# Patient Record
Sex: Female | Born: 1950 | Race: White | Hispanic: No | Marital: Married | Smoking: Never smoker
Health system: Southern US, Community
[De-identification: ages and names within clinical notes are randomized; demographics above are authoritative.]

## PROBLEM LIST (undated history)

## (undated) DIAGNOSIS — F418 Other specified anxiety disorders: Secondary | ICD-10-CM

## (undated) DIAGNOSIS — E079 Disorder of thyroid, unspecified: Secondary | ICD-10-CM

## (undated) DIAGNOSIS — E7800 Pure hypercholesterolemia, unspecified: Secondary | ICD-10-CM

## (undated) DIAGNOSIS — E559 Vitamin D deficiency, unspecified: Secondary | ICD-10-CM

## (undated) DIAGNOSIS — R251 Tremor, unspecified: Secondary | ICD-10-CM

## (undated) DIAGNOSIS — E0590 Thyrotoxicosis, unspecified without thyrotoxic crisis or storm: Secondary | ICD-10-CM

## (undated) DIAGNOSIS — E042 Nontoxic multinodular goiter: Secondary | ICD-10-CM

## (undated) DIAGNOSIS — M8580 Other specified disorders of bone density and structure, unspecified site: Secondary | ICD-10-CM

## (undated) HISTORY — DX: Other specified anxiety disorders: F41.8

## (undated) HISTORY — DX: Nontoxic multinodular goiter: E04.2

## (undated) HISTORY — DX: Vitamin D deficiency, unspecified: E55.9

## (undated) HISTORY — DX: Other specified disorders of bone density and structure, unspecified site: M85.80

## (undated) HISTORY — DX: Tremor, unspecified: R25.1

## (undated) HISTORY — DX: Thyrotoxicosis, unspecified without thyrotoxic crisis or storm: E05.90

---

## 2013-11-03 ENCOUNTER — Other Ambulatory Visit: Payer: Self-pay | Admitting: Orthopedic Surgery

## 2013-11-03 DIAGNOSIS — M25512 Pain in left shoulder: Secondary | ICD-10-CM

## 2013-11-11 ENCOUNTER — Ambulatory Visit
Admission: RE | Admit: 2013-11-11 | Discharge: 2013-11-11 | Disposition: A | Discharge status: Home / Self Care | Payer: Medicare Other | Source: Ambulatory Visit | Attending: Orthopedic Surgery | Admitting: Orthopedic Surgery

## 2013-11-11 DIAGNOSIS — M25512 Pain in left shoulder: Secondary | ICD-10-CM

## 2013-11-11 MED ORDER — IOHEXOL 180 MG/ML  SOLN
15.0000 mL | Freq: Once | INTRAMUSCULAR | Status: AC | PRN
  Administered 2013-11-11: 15 mL via INTRA_ARTICULAR

## 2015-08-31 DIAGNOSIS — Z23 Encounter for immunization: Secondary | ICD-10-CM | POA: Diagnosis not present

## 2015-08-31 DIAGNOSIS — M8589 Other specified disorders of bone density and structure, multiple sites: Secondary | ICD-10-CM | POA: Diagnosis not present

## 2015-08-31 DIAGNOSIS — F411 Generalized anxiety disorder: Secondary | ICD-10-CM | POA: Diagnosis not present

## 2015-08-31 DIAGNOSIS — Z1231 Encounter for screening mammogram for malignant neoplasm of breast: Secondary | ICD-10-CM | POA: Diagnosis not present

## 2015-08-31 DIAGNOSIS — Z6826 Body mass index (BMI) 26.0-26.9, adult: Secondary | ICD-10-CM | POA: Diagnosis not present

## 2015-08-31 DIAGNOSIS — E785 Hyperlipidemia, unspecified: Secondary | ICD-10-CM | POA: Diagnosis not present

## 2015-08-31 DIAGNOSIS — F329 Major depressive disorder, single episode, unspecified: Secondary | ICD-10-CM | POA: Diagnosis not present

## 2015-09-05 DIAGNOSIS — Z1231 Encounter for screening mammogram for malignant neoplasm of breast: Secondary | ICD-10-CM | POA: Diagnosis not present

## 2015-09-05 DIAGNOSIS — M8589 Other specified disorders of bone density and structure, multiple sites: Secondary | ICD-10-CM | POA: Diagnosis not present

## 2015-09-11 DIAGNOSIS — H6092 Unspecified otitis externa, left ear: Secondary | ICD-10-CM | POA: Diagnosis not present

## 2015-12-07 DIAGNOSIS — Z9181 History of falling: Secondary | ICD-10-CM | POA: Diagnosis not present

## 2015-12-07 DIAGNOSIS — Z1389 Encounter for screening for other disorder: Secondary | ICD-10-CM | POA: Diagnosis not present

## 2015-12-07 DIAGNOSIS — E559 Vitamin D deficiency, unspecified: Secondary | ICD-10-CM | POA: Diagnosis not present

## 2015-12-07 DIAGNOSIS — Z79899 Other long term (current) drug therapy: Secondary | ICD-10-CM | POA: Diagnosis not present

## 2015-12-07 DIAGNOSIS — E785 Hyperlipidemia, unspecified: Secondary | ICD-10-CM | POA: Diagnosis not present

## 2015-12-07 DIAGNOSIS — Z6825 Body mass index (BMI) 25.0-25.9, adult: Secondary | ICD-10-CM | POA: Diagnosis not present

## 2015-12-07 DIAGNOSIS — Z0000 Encounter for general adult medical examination without abnormal findings: Secondary | ICD-10-CM | POA: Diagnosis not present

## 2015-12-07 DIAGNOSIS — Z23 Encounter for immunization: Secondary | ICD-10-CM | POA: Diagnosis not present

## 2016-01-05 DIAGNOSIS — R079 Chest pain, unspecified: Secondary | ICD-10-CM | POA: Diagnosis not present

## 2016-01-05 DIAGNOSIS — R0789 Other chest pain: Secondary | ICD-10-CM | POA: Diagnosis not present

## 2016-01-19 DIAGNOSIS — M5412 Radiculopathy, cervical region: Secondary | ICD-10-CM | POA: Diagnosis not present

## 2016-01-19 DIAGNOSIS — Z6826 Body mass index (BMI) 26.0-26.9, adult: Secondary | ICD-10-CM | POA: Diagnosis not present

## 2016-02-12 DIAGNOSIS — M5412 Radiculopathy, cervical region: Secondary | ICD-10-CM | POA: Diagnosis not present

## 2016-02-17 DIAGNOSIS — M5412 Radiculopathy, cervical region: Secondary | ICD-10-CM | POA: Diagnosis not present

## 2016-02-21 DIAGNOSIS — M25512 Pain in left shoulder: Secondary | ICD-10-CM | POA: Diagnosis not present

## 2016-02-21 DIAGNOSIS — M5412 Radiculopathy, cervical region: Secondary | ICD-10-CM | POA: Diagnosis not present

## 2016-02-21 DIAGNOSIS — M6281 Muscle weakness (generalized): Secondary | ICD-10-CM | POA: Diagnosis not present

## 2016-02-23 DIAGNOSIS — M6281 Muscle weakness (generalized): Secondary | ICD-10-CM | POA: Diagnosis not present

## 2016-02-23 DIAGNOSIS — M5412 Radiculopathy, cervical region: Secondary | ICD-10-CM | POA: Diagnosis not present

## 2016-02-23 DIAGNOSIS — M25512 Pain in left shoulder: Secondary | ICD-10-CM | POA: Diagnosis not present

## 2016-02-28 DIAGNOSIS — M5412 Radiculopathy, cervical region: Secondary | ICD-10-CM | POA: Diagnosis not present

## 2016-02-28 DIAGNOSIS — M6281 Muscle weakness (generalized): Secondary | ICD-10-CM | POA: Diagnosis not present

## 2016-02-28 DIAGNOSIS — M25512 Pain in left shoulder: Secondary | ICD-10-CM | POA: Diagnosis not present

## 2016-03-01 DIAGNOSIS — M5412 Radiculopathy, cervical region: Secondary | ICD-10-CM | POA: Diagnosis not present

## 2016-03-01 DIAGNOSIS — M25512 Pain in left shoulder: Secondary | ICD-10-CM | POA: Diagnosis not present

## 2016-03-01 DIAGNOSIS — M6281 Muscle weakness (generalized): Secondary | ICD-10-CM | POA: Diagnosis not present

## 2016-03-06 DIAGNOSIS — M6281 Muscle weakness (generalized): Secondary | ICD-10-CM | POA: Diagnosis not present

## 2016-03-06 DIAGNOSIS — M5412 Radiculopathy, cervical region: Secondary | ICD-10-CM | POA: Diagnosis not present

## 2016-03-06 DIAGNOSIS — M25512 Pain in left shoulder: Secondary | ICD-10-CM | POA: Diagnosis not present

## 2016-03-08 DIAGNOSIS — M25512 Pain in left shoulder: Secondary | ICD-10-CM | POA: Diagnosis not present

## 2016-03-08 DIAGNOSIS — M6281 Muscle weakness (generalized): Secondary | ICD-10-CM | POA: Diagnosis not present

## 2016-03-08 DIAGNOSIS — M5412 Radiculopathy, cervical region: Secondary | ICD-10-CM | POA: Diagnosis not present

## 2016-03-12 DIAGNOSIS — M6281 Muscle weakness (generalized): Secondary | ICD-10-CM | POA: Diagnosis not present

## 2016-03-12 DIAGNOSIS — M5412 Radiculopathy, cervical region: Secondary | ICD-10-CM | POA: Diagnosis not present

## 2016-03-12 DIAGNOSIS — M25512 Pain in left shoulder: Secondary | ICD-10-CM | POA: Diagnosis not present

## 2016-03-15 DIAGNOSIS — M5412 Radiculopathy, cervical region: Secondary | ICD-10-CM | POA: Diagnosis not present

## 2016-03-15 DIAGNOSIS — M25512 Pain in left shoulder: Secondary | ICD-10-CM | POA: Diagnosis not present

## 2016-03-15 DIAGNOSIS — M6281 Muscle weakness (generalized): Secondary | ICD-10-CM | POA: Diagnosis not present

## 2016-03-21 DIAGNOSIS — M6281 Muscle weakness (generalized): Secondary | ICD-10-CM | POA: Diagnosis not present

## 2016-03-21 DIAGNOSIS — M5412 Radiculopathy, cervical region: Secondary | ICD-10-CM | POA: Diagnosis not present

## 2016-03-21 DIAGNOSIS — M25512 Pain in left shoulder: Secondary | ICD-10-CM | POA: Diagnosis not present

## 2016-04-10 DIAGNOSIS — M5412 Radiculopathy, cervical region: Secondary | ICD-10-CM | POA: Diagnosis not present

## 2016-04-19 DIAGNOSIS — M50222 Other cervical disc displacement at C5-C6 level: Secondary | ICD-10-CM | POA: Diagnosis not present

## 2016-04-19 DIAGNOSIS — M5412 Radiculopathy, cervical region: Secondary | ICD-10-CM | POA: Diagnosis not present

## 2016-04-19 DIAGNOSIS — M50221 Other cervical disc displacement at C4-C5 level: Secondary | ICD-10-CM | POA: Diagnosis not present

## 2016-04-19 DIAGNOSIS — M50223 Other cervical disc displacement at C6-C7 level: Secondary | ICD-10-CM | POA: Diagnosis not present

## 2016-04-24 DIAGNOSIS — M67432 Ganglion, left wrist: Secondary | ICD-10-CM | POA: Diagnosis not present

## 2016-04-24 DIAGNOSIS — M5412 Radiculopathy, cervical region: Secondary | ICD-10-CM | POA: Diagnosis not present

## 2016-06-05 DIAGNOSIS — M67432 Ganglion, left wrist: Secondary | ICD-10-CM | POA: Diagnosis not present

## 2016-06-12 DIAGNOSIS — R030 Elevated blood-pressure reading, without diagnosis of hypertension: Secondary | ICD-10-CM | POA: Diagnosis not present

## 2016-06-12 DIAGNOSIS — E785 Hyperlipidemia, unspecified: Secondary | ICD-10-CM | POA: Diagnosis not present

## 2016-06-12 DIAGNOSIS — E559 Vitamin D deficiency, unspecified: Secondary | ICD-10-CM | POA: Diagnosis not present

## 2016-06-12 DIAGNOSIS — F329 Major depressive disorder, single episode, unspecified: Secondary | ICD-10-CM | POA: Diagnosis not present

## 2016-06-12 DIAGNOSIS — F411 Generalized anxiety disorder: Secondary | ICD-10-CM | POA: Diagnosis not present

## 2016-06-18 DIAGNOSIS — Z79899 Other long term (current) drug therapy: Secondary | ICD-10-CM | POA: Diagnosis not present

## 2016-06-18 DIAGNOSIS — M67432 Ganglion, left wrist: Secondary | ICD-10-CM | POA: Diagnosis not present

## 2016-06-18 DIAGNOSIS — F419 Anxiety disorder, unspecified: Secondary | ICD-10-CM | POA: Diagnosis not present

## 2016-06-18 DIAGNOSIS — F329 Major depressive disorder, single episode, unspecified: Secondary | ICD-10-CM | POA: Diagnosis not present

## 2016-06-18 DIAGNOSIS — M67442 Ganglion, left hand: Secondary | ICD-10-CM | POA: Diagnosis not present

## 2016-06-18 DIAGNOSIS — E7800 Pure hypercholesterolemia, unspecified: Secondary | ICD-10-CM | POA: Diagnosis not present

## 2016-06-18 DIAGNOSIS — E559 Vitamin D deficiency, unspecified: Secondary | ICD-10-CM | POA: Diagnosis not present

## 2016-07-03 DIAGNOSIS — M67432 Ganglion, left wrist: Secondary | ICD-10-CM | POA: Diagnosis not present

## 2016-08-19 DIAGNOSIS — J218 Acute bronchiolitis due to other specified organisms: Secondary | ICD-10-CM | POA: Diagnosis not present

## 2016-08-20 DIAGNOSIS — R000 Tachycardia, unspecified: Secondary | ICD-10-CM | POA: Diagnosis not present

## 2016-11-05 DIAGNOSIS — R000 Tachycardia, unspecified: Secondary | ICD-10-CM | POA: Diagnosis not present

## 2016-11-05 DIAGNOSIS — R946 Abnormal results of thyroid function studies: Secondary | ICD-10-CM | POA: Diagnosis not present

## 2016-11-05 DIAGNOSIS — Z1231 Encounter for screening mammogram for malignant neoplasm of breast: Secondary | ICD-10-CM | POA: Diagnosis not present

## 2016-11-15 DIAGNOSIS — R000 Tachycardia, unspecified: Secondary | ICD-10-CM | POA: Diagnosis not present

## 2016-11-15 DIAGNOSIS — E785 Hyperlipidemia, unspecified: Secondary | ICD-10-CM | POA: Diagnosis not present

## 2016-11-15 DIAGNOSIS — Z7689 Persons encountering health services in other specified circumstances: Secondary | ICD-10-CM | POA: Diagnosis not present

## 2016-11-16 DIAGNOSIS — Z1231 Encounter for screening mammogram for malignant neoplasm of breast: Secondary | ICD-10-CM | POA: Diagnosis not present

## 2016-12-03 DIAGNOSIS — R0609 Other forms of dyspnea: Secondary | ICD-10-CM | POA: Diagnosis not present

## 2016-12-03 DIAGNOSIS — R000 Tachycardia, unspecified: Secondary | ICD-10-CM | POA: Diagnosis not present

## 2016-12-05 DIAGNOSIS — R000 Tachycardia, unspecified: Secondary | ICD-10-CM | POA: Diagnosis not present

## 2016-12-11 DIAGNOSIS — F329 Major depressive disorder, single episode, unspecified: Secondary | ICD-10-CM | POA: Diagnosis not present

## 2016-12-11 DIAGNOSIS — Z139 Encounter for screening, unspecified: Secondary | ICD-10-CM | POA: Diagnosis not present

## 2016-12-11 DIAGNOSIS — Z0000 Encounter for general adult medical examination without abnormal findings: Secondary | ICD-10-CM | POA: Diagnosis not present

## 2016-12-11 DIAGNOSIS — R946 Abnormal results of thyroid function studies: Secondary | ICD-10-CM | POA: Diagnosis not present

## 2016-12-11 DIAGNOSIS — E785 Hyperlipidemia, unspecified: Secondary | ICD-10-CM | POA: Diagnosis not present

## 2016-12-11 DIAGNOSIS — F411 Generalized anxiety disorder: Secondary | ICD-10-CM | POA: Diagnosis not present

## 2016-12-11 DIAGNOSIS — R000 Tachycardia, unspecified: Secondary | ICD-10-CM | POA: Diagnosis not present

## 2016-12-11 DIAGNOSIS — Z9181 History of falling: Secondary | ICD-10-CM | POA: Diagnosis not present

## 2016-12-11 DIAGNOSIS — Z1389 Encounter for screening for other disorder: Secondary | ICD-10-CM | POA: Diagnosis not present

## 2017-01-15 DIAGNOSIS — Z136 Encounter for screening for cardiovascular disorders: Secondary | ICD-10-CM | POA: Diagnosis not present

## 2017-01-15 DIAGNOSIS — Z23 Encounter for immunization: Secondary | ICD-10-CM | POA: Diagnosis not present

## 2017-01-15 DIAGNOSIS — Z1389 Encounter for screening for other disorder: Secondary | ICD-10-CM | POA: Diagnosis not present

## 2017-01-15 DIAGNOSIS — Z9181 History of falling: Secondary | ICD-10-CM | POA: Diagnosis not present

## 2017-01-15 DIAGNOSIS — E785 Hyperlipidemia, unspecified: Secondary | ICD-10-CM | POA: Diagnosis not present

## 2017-01-15 DIAGNOSIS — Z0000 Encounter for general adult medical examination without abnormal findings: Secondary | ICD-10-CM | POA: Diagnosis not present

## 2017-02-04 DIAGNOSIS — E0590 Thyrotoxicosis, unspecified without thyrotoxic crisis or storm: Secondary | ICD-10-CM | POA: Diagnosis not present

## 2017-02-08 DIAGNOSIS — E0590 Thyrotoxicosis, unspecified without thyrotoxic crisis or storm: Secondary | ICD-10-CM | POA: Diagnosis not present

## 2017-02-08 DIAGNOSIS — E042 Nontoxic multinodular goiter: Secondary | ICD-10-CM | POA: Diagnosis not present

## 2017-03-11 DIAGNOSIS — E042 Nontoxic multinodular goiter: Secondary | ICD-10-CM | POA: Diagnosis not present

## 2017-03-11 DIAGNOSIS — E0590 Thyrotoxicosis, unspecified without thyrotoxic crisis or storm: Secondary | ICD-10-CM | POA: Diagnosis not present

## 2017-03-21 DIAGNOSIS — F411 Generalized anxiety disorder: Secondary | ICD-10-CM | POA: Diagnosis not present

## 2017-03-21 DIAGNOSIS — E559 Vitamin D deficiency, unspecified: Secondary | ICD-10-CM | POA: Diagnosis not present

## 2017-03-21 DIAGNOSIS — E0590 Thyrotoxicosis, unspecified without thyrotoxic crisis or storm: Secondary | ICD-10-CM | POA: Diagnosis not present

## 2017-03-21 DIAGNOSIS — E785 Hyperlipidemia, unspecified: Secondary | ICD-10-CM | POA: Diagnosis not present

## 2017-03-21 DIAGNOSIS — Z6827 Body mass index (BMI) 27.0-27.9, adult: Secondary | ICD-10-CM | POA: Diagnosis not present

## 2017-03-21 DIAGNOSIS — R000 Tachycardia, unspecified: Secondary | ICD-10-CM | POA: Diagnosis not present

## 2017-06-06 DIAGNOSIS — R300 Dysuria: Secondary | ICD-10-CM | POA: Diagnosis not present

## 2017-06-06 DIAGNOSIS — N390 Urinary tract infection, site not specified: Secondary | ICD-10-CM | POA: Diagnosis not present

## 2017-06-06 DIAGNOSIS — Z23 Encounter for immunization: Secondary | ICD-10-CM | POA: Diagnosis not present

## 2017-06-12 DIAGNOSIS — E042 Nontoxic multinodular goiter: Secondary | ICD-10-CM | POA: Diagnosis not present

## 2017-06-12 DIAGNOSIS — E559 Vitamin D deficiency, unspecified: Secondary | ICD-10-CM | POA: Diagnosis not present

## 2017-06-12 DIAGNOSIS — E0590 Thyrotoxicosis, unspecified without thyrotoxic crisis or storm: Secondary | ICD-10-CM | POA: Diagnosis not present

## 2017-06-25 DIAGNOSIS — R002 Palpitations: Secondary | ICD-10-CM | POA: Diagnosis not present

## 2017-06-25 DIAGNOSIS — E0590 Thyrotoxicosis, unspecified without thyrotoxic crisis or storm: Secondary | ICD-10-CM | POA: Diagnosis not present

## 2017-06-25 DIAGNOSIS — I10 Essential (primary) hypertension: Secondary | ICD-10-CM | POA: Diagnosis not present

## 2017-06-25 DIAGNOSIS — E785 Hyperlipidemia, unspecified: Secondary | ICD-10-CM | POA: Diagnosis not present

## 2017-07-24 DIAGNOSIS — E0590 Thyrotoxicosis, unspecified without thyrotoxic crisis or storm: Secondary | ICD-10-CM | POA: Diagnosis not present

## 2017-07-24 DIAGNOSIS — E785 Hyperlipidemia, unspecified: Secondary | ICD-10-CM | POA: Diagnosis not present

## 2017-07-24 DIAGNOSIS — Z6828 Body mass index (BMI) 28.0-28.9, adult: Secondary | ICD-10-CM | POA: Diagnosis not present

## 2017-07-24 DIAGNOSIS — E559 Vitamin D deficiency, unspecified: Secondary | ICD-10-CM | POA: Diagnosis not present

## 2017-07-24 DIAGNOSIS — F411 Generalized anxiety disorder: Secondary | ICD-10-CM | POA: Diagnosis not present

## 2017-07-24 DIAGNOSIS — F329 Major depressive disorder, single episode, unspecified: Secondary | ICD-10-CM | POA: Diagnosis not present

## 2017-10-15 DIAGNOSIS — E042 Nontoxic multinodular goiter: Secondary | ICD-10-CM | POA: Diagnosis not present

## 2017-10-15 DIAGNOSIS — E559 Vitamin D deficiency, unspecified: Secondary | ICD-10-CM | POA: Diagnosis not present

## 2017-10-15 DIAGNOSIS — E0590 Thyrotoxicosis, unspecified without thyrotoxic crisis or storm: Secondary | ICD-10-CM | POA: Diagnosis not present

## 2017-11-26 DIAGNOSIS — E559 Vitamin D deficiency, unspecified: Secondary | ICD-10-CM | POA: Diagnosis not present

## 2017-11-26 DIAGNOSIS — Z1211 Encounter for screening for malignant neoplasm of colon: Secondary | ICD-10-CM | POA: Diagnosis not present

## 2017-11-26 DIAGNOSIS — R739 Hyperglycemia, unspecified: Secondary | ICD-10-CM | POA: Diagnosis not present

## 2017-11-26 DIAGNOSIS — E0590 Thyrotoxicosis, unspecified without thyrotoxic crisis or storm: Secondary | ICD-10-CM | POA: Diagnosis not present

## 2017-11-26 DIAGNOSIS — Z1231 Encounter for screening mammogram for malignant neoplasm of breast: Secondary | ICD-10-CM | POA: Diagnosis not present

## 2017-11-26 DIAGNOSIS — F329 Major depressive disorder, single episode, unspecified: Secondary | ICD-10-CM | POA: Diagnosis not present

## 2017-11-26 DIAGNOSIS — E785 Hyperlipidemia, unspecified: Secondary | ICD-10-CM | POA: Diagnosis not present

## 2017-11-26 DIAGNOSIS — M8589 Other specified disorders of bone density and structure, multiple sites: Secondary | ICD-10-CM | POA: Diagnosis not present

## 2017-12-16 DIAGNOSIS — Z1211 Encounter for screening for malignant neoplasm of colon: Secondary | ICD-10-CM | POA: Diagnosis not present

## 2017-12-16 DIAGNOSIS — Z1212 Encounter for screening for malignant neoplasm of rectum: Secondary | ICD-10-CM | POA: Diagnosis not present

## 2018-01-01 DIAGNOSIS — M8580 Other specified disorders of bone density and structure, unspecified site: Secondary | ICD-10-CM | POA: Diagnosis not present

## 2018-01-01 DIAGNOSIS — Z1231 Encounter for screening mammogram for malignant neoplasm of breast: Secondary | ICD-10-CM | POA: Diagnosis not present

## 2018-01-01 DIAGNOSIS — M8589 Other specified disorders of bone density and structure, multiple sites: Secondary | ICD-10-CM | POA: Diagnosis not present

## 2018-01-01 DIAGNOSIS — R928 Other abnormal and inconclusive findings on diagnostic imaging of breast: Secondary | ICD-10-CM | POA: Diagnosis not present

## 2018-01-22 DIAGNOSIS — R922 Inconclusive mammogram: Secondary | ICD-10-CM | POA: Diagnosis not present

## 2018-01-22 DIAGNOSIS — R928 Other abnormal and inconclusive findings on diagnostic imaging of breast: Secondary | ICD-10-CM | POA: Diagnosis not present

## 2018-01-22 DIAGNOSIS — N6489 Other specified disorders of breast: Secondary | ICD-10-CM | POA: Diagnosis not present

## 2018-02-10 DIAGNOSIS — E042 Nontoxic multinodular goiter: Secondary | ICD-10-CM | POA: Diagnosis not present

## 2018-03-22 DIAGNOSIS — N3090 Cystitis, unspecified without hematuria: Secondary | ICD-10-CM | POA: Diagnosis not present

## 2018-03-22 DIAGNOSIS — N3001 Acute cystitis with hematuria: Secondary | ICD-10-CM | POA: Diagnosis not present

## 2018-03-23 DIAGNOSIS — N3091 Cystitis, unspecified with hematuria: Secondary | ICD-10-CM | POA: Diagnosis not present

## 2018-03-25 DIAGNOSIS — N3001 Acute cystitis with hematuria: Secondary | ICD-10-CM | POA: Diagnosis not present

## 2018-03-25 DIAGNOSIS — R3915 Urgency of urination: Secondary | ICD-10-CM | POA: Diagnosis not present

## 2018-04-03 DIAGNOSIS — E785 Hyperlipidemia, unspecified: Secondary | ICD-10-CM | POA: Diagnosis not present

## 2018-04-03 DIAGNOSIS — R739 Hyperglycemia, unspecified: Secondary | ICD-10-CM | POA: Diagnosis not present

## 2018-04-03 DIAGNOSIS — F411 Generalized anxiety disorder: Secondary | ICD-10-CM | POA: Diagnosis not present

## 2018-04-03 DIAGNOSIS — E0590 Thyrotoxicosis, unspecified without thyrotoxic crisis or storm: Secondary | ICD-10-CM | POA: Diagnosis not present

## 2018-04-03 DIAGNOSIS — E559 Vitamin D deficiency, unspecified: Secondary | ICD-10-CM | POA: Diagnosis not present

## 2018-04-18 DIAGNOSIS — E0590 Thyrotoxicosis, unspecified without thyrotoxic crisis or storm: Secondary | ICD-10-CM | POA: Diagnosis not present

## 2018-04-18 DIAGNOSIS — E042 Nontoxic multinodular goiter: Secondary | ICD-10-CM | POA: Diagnosis not present

## 2018-04-18 DIAGNOSIS — E559 Vitamin D deficiency, unspecified: Secondary | ICD-10-CM | POA: Diagnosis not present

## 2018-04-23 DIAGNOSIS — N939 Abnormal uterine and vaginal bleeding, unspecified: Secondary | ICD-10-CM | POA: Diagnosis not present

## 2018-04-23 DIAGNOSIS — N952 Postmenopausal atrophic vaginitis: Secondary | ICD-10-CM | POA: Diagnosis not present

## 2018-04-23 DIAGNOSIS — B373 Candidiasis of vulva and vagina: Secondary | ICD-10-CM | POA: Diagnosis not present

## 2018-04-23 DIAGNOSIS — N390 Urinary tract infection, site not specified: Secondary | ICD-10-CM | POA: Diagnosis not present

## 2018-04-23 DIAGNOSIS — Z79899 Other long term (current) drug therapy: Secondary | ICD-10-CM | POA: Diagnosis not present

## 2018-04-23 DIAGNOSIS — N3946 Mixed incontinence: Secondary | ICD-10-CM | POA: Diagnosis not present

## 2018-04-23 DIAGNOSIS — R319 Hematuria, unspecified: Secondary | ICD-10-CM | POA: Diagnosis not present

## 2018-05-02 DIAGNOSIS — R319 Hematuria, unspecified: Secondary | ICD-10-CM | POA: Diagnosis not present

## 2018-05-21 DIAGNOSIS — N939 Abnormal uterine and vaginal bleeding, unspecified: Secondary | ICD-10-CM | POA: Diagnosis not present

## 2018-05-21 DIAGNOSIS — B373 Candidiasis of vulva and vagina: Secondary | ICD-10-CM | POA: Diagnosis not present

## 2018-05-21 DIAGNOSIS — N390 Urinary tract infection, site not specified: Secondary | ICD-10-CM | POA: Diagnosis not present

## 2018-05-21 DIAGNOSIS — N952 Postmenopausal atrophic vaginitis: Secondary | ICD-10-CM | POA: Diagnosis not present

## 2018-05-21 DIAGNOSIS — R310 Gross hematuria: Secondary | ICD-10-CM | POA: Diagnosis not present

## 2018-06-18 DIAGNOSIS — Z23 Encounter for immunization: Secondary | ICD-10-CM | POA: Diagnosis not present

## 2018-07-01 DIAGNOSIS — E0590 Thyrotoxicosis, unspecified without thyrotoxic crisis or storm: Secondary | ICD-10-CM | POA: Diagnosis not present

## 2018-07-01 DIAGNOSIS — E785 Hyperlipidemia, unspecified: Secondary | ICD-10-CM | POA: Diagnosis not present

## 2018-07-01 DIAGNOSIS — R002 Palpitations: Secondary | ICD-10-CM | POA: Diagnosis not present

## 2018-07-01 DIAGNOSIS — I10 Essential (primary) hypertension: Secondary | ICD-10-CM | POA: Diagnosis not present

## 2018-07-31 DIAGNOSIS — E559 Vitamin D deficiency, unspecified: Secondary | ICD-10-CM | POA: Diagnosis not present

## 2018-07-31 DIAGNOSIS — N952 Postmenopausal atrophic vaginitis: Secondary | ICD-10-CM | POA: Diagnosis not present

## 2018-07-31 DIAGNOSIS — Z6828 Body mass index (BMI) 28.0-28.9, adult: Secondary | ICD-10-CM | POA: Diagnosis not present

## 2018-07-31 DIAGNOSIS — Z139 Encounter for screening, unspecified: Secondary | ICD-10-CM | POA: Diagnosis not present

## 2018-07-31 DIAGNOSIS — E785 Hyperlipidemia, unspecified: Secondary | ICD-10-CM | POA: Diagnosis not present

## 2018-07-31 DIAGNOSIS — F411 Generalized anxiety disorder: Secondary | ICD-10-CM | POA: Diagnosis not present

## 2018-07-31 DIAGNOSIS — E0590 Thyrotoxicosis, unspecified without thyrotoxic crisis or storm: Secondary | ICD-10-CM | POA: Diagnosis not present

## 2018-07-31 DIAGNOSIS — R739 Hyperglycemia, unspecified: Secondary | ICD-10-CM | POA: Diagnosis not present

## 2018-07-31 DIAGNOSIS — Z9181 History of falling: Secondary | ICD-10-CM | POA: Diagnosis not present

## 2018-07-31 DIAGNOSIS — Z1331 Encounter for screening for depression: Secondary | ICD-10-CM | POA: Diagnosis not present

## 2018-07-31 DIAGNOSIS — Z0000 Encounter for general adult medical examination without abnormal findings: Secondary | ICD-10-CM | POA: Diagnosis not present

## 2018-10-02 DIAGNOSIS — J309 Allergic rhinitis, unspecified: Secondary | ICD-10-CM | POA: Diagnosis not present

## 2018-10-02 DIAGNOSIS — Z6828 Body mass index (BMI) 28.0-28.9, adult: Secondary | ICD-10-CM | POA: Diagnosis not present

## 2018-10-02 DIAGNOSIS — J069 Acute upper respiratory infection, unspecified: Secondary | ICD-10-CM | POA: Diagnosis not present

## 2018-10-21 DIAGNOSIS — E559 Vitamin D deficiency, unspecified: Secondary | ICD-10-CM | POA: Diagnosis not present

## 2018-10-21 DIAGNOSIS — E042 Nontoxic multinodular goiter: Secondary | ICD-10-CM | POA: Diagnosis not present

## 2018-10-21 DIAGNOSIS — E0590 Thyrotoxicosis, unspecified without thyrotoxic crisis or storm: Secondary | ICD-10-CM | POA: Diagnosis not present

## 2019-01-29 DIAGNOSIS — R000 Tachycardia, unspecified: Secondary | ICD-10-CM | POA: Diagnosis not present

## 2019-01-29 DIAGNOSIS — E785 Hyperlipidemia, unspecified: Secondary | ICD-10-CM | POA: Diagnosis not present

## 2019-01-29 DIAGNOSIS — Z6828 Body mass index (BMI) 28.0-28.9, adult: Secondary | ICD-10-CM | POA: Diagnosis not present

## 2019-01-29 DIAGNOSIS — E559 Vitamin D deficiency, unspecified: Secondary | ICD-10-CM | POA: Diagnosis not present

## 2019-01-29 DIAGNOSIS — R739 Hyperglycemia, unspecified: Secondary | ICD-10-CM | POA: Diagnosis not present

## 2019-01-29 DIAGNOSIS — F411 Generalized anxiety disorder: Secondary | ICD-10-CM | POA: Diagnosis not present

## 2019-01-29 DIAGNOSIS — E663 Overweight: Secondary | ICD-10-CM | POA: Diagnosis not present

## 2019-01-29 DIAGNOSIS — E0590 Thyrotoxicosis, unspecified without thyrotoxic crisis or storm: Secondary | ICD-10-CM | POA: Diagnosis not present

## 2019-06-01 DIAGNOSIS — R739 Hyperglycemia, unspecified: Secondary | ICD-10-CM | POA: Diagnosis not present

## 2019-06-01 DIAGNOSIS — E663 Overweight: Secondary | ICD-10-CM | POA: Diagnosis not present

## 2019-06-01 DIAGNOSIS — Z23 Encounter for immunization: Secondary | ICD-10-CM | POA: Diagnosis not present

## 2019-06-01 DIAGNOSIS — R251 Tremor, unspecified: Secondary | ICD-10-CM | POA: Diagnosis not present

## 2019-06-01 DIAGNOSIS — Z1231 Encounter for screening mammogram for malignant neoplasm of breast: Secondary | ICD-10-CM | POA: Diagnosis not present

## 2019-06-01 DIAGNOSIS — Z6828 Body mass index (BMI) 28.0-28.9, adult: Secondary | ICD-10-CM | POA: Diagnosis not present

## 2019-06-01 DIAGNOSIS — E0590 Thyrotoxicosis, unspecified without thyrotoxic crisis or storm: Secondary | ICD-10-CM | POA: Diagnosis not present

## 2019-06-01 DIAGNOSIS — F411 Generalized anxiety disorder: Secondary | ICD-10-CM | POA: Diagnosis not present

## 2019-06-01 DIAGNOSIS — E559 Vitamin D deficiency, unspecified: Secondary | ICD-10-CM | POA: Diagnosis not present

## 2019-06-01 DIAGNOSIS — E785 Hyperlipidemia, unspecified: Secondary | ICD-10-CM | POA: Diagnosis not present

## 2019-06-01 DIAGNOSIS — R000 Tachycardia, unspecified: Secondary | ICD-10-CM | POA: Diagnosis not present

## 2019-08-04 DIAGNOSIS — E785 Hyperlipidemia, unspecified: Secondary | ICD-10-CM | POA: Diagnosis not present

## 2019-08-04 DIAGNOSIS — Z1231 Encounter for screening mammogram for malignant neoplasm of breast: Secondary | ICD-10-CM | POA: Diagnosis not present

## 2019-08-04 DIAGNOSIS — Z9181 History of falling: Secondary | ICD-10-CM | POA: Diagnosis not present

## 2019-08-04 DIAGNOSIS — Z0000 Encounter for general adult medical examination without abnormal findings: Secondary | ICD-10-CM | POA: Diagnosis not present

## 2019-08-04 DIAGNOSIS — Z139 Encounter for screening, unspecified: Secondary | ICD-10-CM | POA: Diagnosis not present

## 2019-08-04 DIAGNOSIS — N959 Unspecified menopausal and perimenopausal disorder: Secondary | ICD-10-CM | POA: Diagnosis not present

## 2019-08-04 DIAGNOSIS — Z1331 Encounter for screening for depression: Secondary | ICD-10-CM | POA: Diagnosis not present

## 2019-08-05 DIAGNOSIS — Z1231 Encounter for screening mammogram for malignant neoplasm of breast: Secondary | ICD-10-CM | POA: Diagnosis not present

## 2019-08-27 DIAGNOSIS — Z6828 Body mass index (BMI) 28.0-28.9, adult: Secondary | ICD-10-CM | POA: Diagnosis not present

## 2019-08-27 DIAGNOSIS — B373 Candidiasis of vulva and vagina: Secondary | ICD-10-CM | POA: Diagnosis not present

## 2019-10-13 DIAGNOSIS — E559 Vitamin D deficiency, unspecified: Secondary | ICD-10-CM | POA: Diagnosis not present

## 2019-10-13 DIAGNOSIS — R739 Hyperglycemia, unspecified: Secondary | ICD-10-CM | POA: Diagnosis not present

## 2019-10-13 DIAGNOSIS — F411 Generalized anxiety disorder: Secondary | ICD-10-CM | POA: Diagnosis not present

## 2019-10-13 DIAGNOSIS — R251 Tremor, unspecified: Secondary | ICD-10-CM | POA: Diagnosis not present

## 2019-10-13 DIAGNOSIS — E785 Hyperlipidemia, unspecified: Secondary | ICD-10-CM | POA: Diagnosis not present

## 2019-10-13 DIAGNOSIS — R000 Tachycardia, unspecified: Secondary | ICD-10-CM | POA: Diagnosis not present

## 2019-10-13 DIAGNOSIS — E663 Overweight: Secondary | ICD-10-CM | POA: Diagnosis not present

## 2019-10-13 DIAGNOSIS — Z6829 Body mass index (BMI) 29.0-29.9, adult: Secondary | ICD-10-CM | POA: Diagnosis not present

## 2019-10-13 DIAGNOSIS — E0590 Thyrotoxicosis, unspecified without thyrotoxic crisis or storm: Secondary | ICD-10-CM | POA: Diagnosis not present

## 2019-10-23 DIAGNOSIS — E559 Vitamin D deficiency, unspecified: Secondary | ICD-10-CM | POA: Diagnosis not present

## 2019-10-23 DIAGNOSIS — E0590 Thyrotoxicosis, unspecified without thyrotoxic crisis or storm: Secondary | ICD-10-CM | POA: Diagnosis not present

## 2019-10-23 DIAGNOSIS — E042 Nontoxic multinodular goiter: Secondary | ICD-10-CM | POA: Diagnosis not present

## 2019-10-26 DIAGNOSIS — E042 Nontoxic multinodular goiter: Secondary | ICD-10-CM | POA: Diagnosis not present

## 2020-01-13 DIAGNOSIS — M8589 Other specified disorders of bone density and structure, multiple sites: Secondary | ICD-10-CM | POA: Diagnosis not present

## 2020-01-13 DIAGNOSIS — N959 Unspecified menopausal and perimenopausal disorder: Secondary | ICD-10-CM | POA: Diagnosis not present

## 2020-02-22 DIAGNOSIS — R109 Unspecified abdominal pain: Secondary | ICD-10-CM | POA: Diagnosis not present

## 2020-03-07 DIAGNOSIS — Z20828 Contact with and (suspected) exposure to other viral communicable diseases: Secondary | ICD-10-CM | POA: Diagnosis not present

## 2020-03-07 DIAGNOSIS — R05 Cough: Secondary | ICD-10-CM | POA: Diagnosis not present

## 2020-03-07 DIAGNOSIS — R509 Fever, unspecified: Secondary | ICD-10-CM | POA: Diagnosis not present

## 2020-04-15 DIAGNOSIS — M8580 Other specified disorders of bone density and structure, unspecified site: Secondary | ICD-10-CM | POA: Diagnosis not present

## 2020-04-15 DIAGNOSIS — R739 Hyperglycemia, unspecified: Secondary | ICD-10-CM | POA: Diagnosis not present

## 2020-04-15 DIAGNOSIS — F411 Generalized anxiety disorder: Secondary | ICD-10-CM | POA: Diagnosis not present

## 2020-04-15 DIAGNOSIS — Z6829 Body mass index (BMI) 29.0-29.9, adult: Secondary | ICD-10-CM | POA: Diagnosis not present

## 2020-04-15 DIAGNOSIS — E0590 Thyrotoxicosis, unspecified without thyrotoxic crisis or storm: Secondary | ICD-10-CM | POA: Diagnosis not present

## 2020-04-15 DIAGNOSIS — F3340 Major depressive disorder, recurrent, in remission, unspecified: Secondary | ICD-10-CM | POA: Diagnosis not present

## 2020-04-15 DIAGNOSIS — R000 Tachycardia, unspecified: Secondary | ICD-10-CM | POA: Diagnosis not present

## 2020-04-15 DIAGNOSIS — E559 Vitamin D deficiency, unspecified: Secondary | ICD-10-CM | POA: Diagnosis not present

## 2020-04-15 DIAGNOSIS — E785 Hyperlipidemia, unspecified: Secondary | ICD-10-CM | POA: Diagnosis not present

## 2020-08-04 DIAGNOSIS — Z9181 History of falling: Secondary | ICD-10-CM | POA: Diagnosis not present

## 2020-08-04 DIAGNOSIS — Z1331 Encounter for screening for depression: Secondary | ICD-10-CM | POA: Diagnosis not present

## 2020-08-04 DIAGNOSIS — E785 Hyperlipidemia, unspecified: Secondary | ICD-10-CM | POA: Diagnosis not present

## 2020-08-04 DIAGNOSIS — Z0000 Encounter for general adult medical examination without abnormal findings: Secondary | ICD-10-CM | POA: Diagnosis not present

## 2020-08-09 DIAGNOSIS — Z1231 Encounter for screening mammogram for malignant neoplasm of breast: Secondary | ICD-10-CM | POA: Diagnosis not present

## 2020-09-07 DIAGNOSIS — R928 Other abnormal and inconclusive findings on diagnostic imaging of breast: Secondary | ICD-10-CM | POA: Diagnosis not present

## 2020-10-13 DIAGNOSIS — F3340 Major depressive disorder, recurrent, in remission, unspecified: Secondary | ICD-10-CM | POA: Diagnosis not present

## 2020-10-13 DIAGNOSIS — E0590 Thyrotoxicosis, unspecified without thyrotoxic crisis or storm: Secondary | ICD-10-CM | POA: Diagnosis not present

## 2020-10-13 DIAGNOSIS — R000 Tachycardia, unspecified: Secondary | ICD-10-CM | POA: Diagnosis not present

## 2020-10-13 DIAGNOSIS — E559 Vitamin D deficiency, unspecified: Secondary | ICD-10-CM | POA: Diagnosis not present

## 2020-10-13 DIAGNOSIS — B373 Candidiasis of vulva and vagina: Secondary | ICD-10-CM | POA: Diagnosis not present

## 2020-10-13 DIAGNOSIS — M8580 Other specified disorders of bone density and structure, unspecified site: Secondary | ICD-10-CM | POA: Diagnosis not present

## 2020-10-13 DIAGNOSIS — F411 Generalized anxiety disorder: Secondary | ICD-10-CM | POA: Diagnosis not present

## 2020-10-13 DIAGNOSIS — Z139 Encounter for screening, unspecified: Secondary | ICD-10-CM | POA: Diagnosis not present

## 2020-10-13 DIAGNOSIS — E785 Hyperlipidemia, unspecified: Secondary | ICD-10-CM | POA: Diagnosis not present

## 2020-10-13 DIAGNOSIS — Z6830 Body mass index (BMI) 30.0-30.9, adult: Secondary | ICD-10-CM | POA: Diagnosis not present

## 2020-10-13 DIAGNOSIS — R739 Hyperglycemia, unspecified: Secondary | ICD-10-CM | POA: Diagnosis not present

## 2020-10-13 DIAGNOSIS — E669 Obesity, unspecified: Secondary | ICD-10-CM | POA: Diagnosis not present

## 2020-10-26 DIAGNOSIS — E042 Nontoxic multinodular goiter: Secondary | ICD-10-CM | POA: Diagnosis not present

## 2020-10-26 DIAGNOSIS — E559 Vitamin D deficiency, unspecified: Secondary | ICD-10-CM | POA: Diagnosis not present

## 2020-10-26 DIAGNOSIS — E0590 Thyrotoxicosis, unspecified without thyrotoxic crisis or storm: Secondary | ICD-10-CM | POA: Diagnosis not present

## 2020-11-21 DIAGNOSIS — J069 Acute upper respiratory infection, unspecified: Secondary | ICD-10-CM | POA: Diagnosis not present

## 2020-11-21 DIAGNOSIS — J309 Allergic rhinitis, unspecified: Secondary | ICD-10-CM | POA: Diagnosis not present

## 2021-03-09 DIAGNOSIS — J069 Acute upper respiratory infection, unspecified: Secondary | ICD-10-CM | POA: Diagnosis not present

## 2021-04-20 DIAGNOSIS — R000 Tachycardia, unspecified: Secondary | ICD-10-CM | POA: Diagnosis not present

## 2021-04-20 DIAGNOSIS — E669 Obesity, unspecified: Secondary | ICD-10-CM | POA: Diagnosis not present

## 2021-04-20 DIAGNOSIS — M8580 Other specified disorders of bone density and structure, unspecified site: Secondary | ICD-10-CM | POA: Diagnosis not present

## 2021-04-20 DIAGNOSIS — G4762 Sleep related leg cramps: Secondary | ICD-10-CM | POA: Diagnosis not present

## 2021-04-20 DIAGNOSIS — F411 Generalized anxiety disorder: Secondary | ICD-10-CM | POA: Diagnosis not present

## 2021-04-20 DIAGNOSIS — Z6829 Body mass index (BMI) 29.0-29.9, adult: Secondary | ICD-10-CM | POA: Diagnosis not present

## 2021-04-20 DIAGNOSIS — E559 Vitamin D deficiency, unspecified: Secondary | ICD-10-CM | POA: Diagnosis not present

## 2021-04-20 DIAGNOSIS — R739 Hyperglycemia, unspecified: Secondary | ICD-10-CM | POA: Diagnosis not present

## 2021-04-20 DIAGNOSIS — E785 Hyperlipidemia, unspecified: Secondary | ICD-10-CM | POA: Diagnosis not present

## 2021-04-20 DIAGNOSIS — F3340 Major depressive disorder, recurrent, in remission, unspecified: Secondary | ICD-10-CM | POA: Diagnosis not present

## 2021-04-20 DIAGNOSIS — R928 Other abnormal and inconclusive findings on diagnostic imaging of breast: Secondary | ICD-10-CM | POA: Diagnosis not present

## 2021-04-20 DIAGNOSIS — E0590 Thyrotoxicosis, unspecified without thyrotoxic crisis or storm: Secondary | ICD-10-CM | POA: Diagnosis not present

## 2021-05-18 DIAGNOSIS — R922 Inconclusive mammogram: Secondary | ICD-10-CM | POA: Diagnosis not present

## 2021-05-18 DIAGNOSIS — R928 Other abnormal and inconclusive findings on diagnostic imaging of breast: Secondary | ICD-10-CM | POA: Diagnosis not present

## 2021-05-30 DIAGNOSIS — H9201 Otalgia, right ear: Secondary | ICD-10-CM | POA: Diagnosis not present

## 2021-05-30 DIAGNOSIS — H6123 Impacted cerumen, bilateral: Secondary | ICD-10-CM | POA: Diagnosis not present

## 2021-07-03 DIAGNOSIS — Z6829 Body mass index (BMI) 29.0-29.9, adult: Secondary | ICD-10-CM | POA: Diagnosis not present

## 2021-07-03 DIAGNOSIS — H9201 Otalgia, right ear: Secondary | ICD-10-CM | POA: Diagnosis not present

## 2021-07-19 ENCOUNTER — Other Ambulatory Visit: Payer: Self-pay

## 2021-07-19 ENCOUNTER — Emergency Department (HOSPITAL_BASED_OUTPATIENT_CLINIC_OR_DEPARTMENT_OTHER)
Admission: EM | Admit: 2021-07-19 | Discharge: 2021-07-20 | Disposition: A | Discharge status: Home / Self Care | Payer: PPO | Attending: Emergency Medicine | Admitting: Emergency Medicine

## 2021-07-19 ENCOUNTER — Encounter (HOSPITAL_BASED_OUTPATIENT_CLINIC_OR_DEPARTMENT_OTHER): Payer: Self-pay

## 2021-07-19 ENCOUNTER — Emergency Department (HOSPITAL_BASED_OUTPATIENT_CLINIC_OR_DEPARTMENT_OTHER): Payer: PPO

## 2021-07-19 DIAGNOSIS — M62838 Other muscle spasm: Secondary | ICD-10-CM | POA: Insufficient documentation

## 2021-07-19 DIAGNOSIS — Z20822 Contact with and (suspected) exposure to covid-19: Secondary | ICD-10-CM | POA: Diagnosis not present

## 2021-07-19 DIAGNOSIS — R519 Headache, unspecified: Secondary | ICD-10-CM | POA: Insufficient documentation

## 2021-07-19 DIAGNOSIS — Z7901 Long term (current) use of anticoagulants: Secondary | ICD-10-CM | POA: Insufficient documentation

## 2021-07-19 DIAGNOSIS — M542 Cervicalgia: Secondary | ICD-10-CM | POA: Insufficient documentation

## 2021-07-19 DIAGNOSIS — I6782 Cerebral ischemia: Secondary | ICD-10-CM | POA: Diagnosis not present

## 2021-07-19 DIAGNOSIS — E041 Nontoxic single thyroid nodule: Secondary | ICD-10-CM | POA: Diagnosis not present

## 2021-07-19 DIAGNOSIS — I6523 Occlusion and stenosis of bilateral carotid arteries: Secondary | ICD-10-CM | POA: Diagnosis not present

## 2021-07-19 HISTORY — DX: Disorder of thyroid, unspecified: E07.9

## 2021-07-19 HISTORY — DX: Pure hypercholesterolemia, unspecified: E78.00

## 2021-07-19 LAB — CBC WITH DIFFERENTIAL/PLATELET
Abs Immature Granulocytes: 0.03 10*3/uL (ref 0.00–0.07)
Basophils Absolute: 0.1 10*3/uL (ref 0.0–0.1)
Basophils Relative: 1 %
Eosinophils Absolute: 0.1 10*3/uL (ref 0.0–0.5)
Eosinophils Relative: 1 %
HCT: 45.5 % (ref 36.0–46.0)
Hemoglobin: 14.9 g/dL (ref 12.0–15.0)
Immature Granulocytes: 0 %
Lymphocytes Relative: 19 %
Lymphs Abs: 1.8 10*3/uL (ref 0.7–4.0)
MCH: 30.6 pg (ref 26.0–34.0)
MCHC: 32.7 g/dL (ref 30.0–36.0)
MCV: 93.4 fL (ref 80.0–100.0)
Monocytes Absolute: 1.1 10*3/uL — ABNORMAL HIGH (ref 0.1–1.0)
Monocytes Relative: 11 %
Neutro Abs: 6.3 10*3/uL (ref 1.7–7.7)
Neutrophils Relative %: 68 %
Platelets: 341 10*3/uL (ref 150–400)
RBC: 4.87 MIL/uL (ref 3.87–5.11)
RDW: 12.4 % (ref 11.5–15.5)
WBC: 9.3 10*3/uL (ref 4.0–10.5)
nRBC: 0 % (ref 0.0–0.2)

## 2021-07-19 LAB — COMPREHENSIVE METABOLIC PANEL
ALT: 27 U/L (ref 0–44)
AST: 24 U/L (ref 15–41)
Albumin: 4.5 g/dL (ref 3.5–5.0)
Alkaline Phosphatase: 107 U/L (ref 38–126)
Anion gap: 10 (ref 5–15)
BUN: 20 mg/dL (ref 8–23)
CO2: 24 mmol/L (ref 22–32)
Calcium: 9.1 mg/dL (ref 8.9–10.3)
Chloride: 103 mmol/L (ref 98–111)
Creatinine, Ser: 1.04 mg/dL — ABNORMAL HIGH (ref 0.44–1.00)
GFR, Estimated: 57 mL/min — ABNORMAL LOW (ref >60)
Glucose, Bld: 127 mg/dL — ABNORMAL HIGH (ref 70–99)
Potassium: 4 mmol/L (ref 3.5–5.1)
Sodium: 137 mmol/L (ref 135–145)
Total Bilirubin: 0.2 mg/dL — ABNORMAL LOW (ref 0.3–1.2)
Total Protein: 7.7 g/dL (ref 6.5–8.1)

## 2021-07-19 LAB — RESP PANEL BY RT-PCR (FLU A&B, COVID) ARPGX2
Influenza A by PCR: NEGATIVE
Influenza B by PCR: NEGATIVE
SARS Coronavirus 2 by RT PCR: NEGATIVE

## 2021-07-19 LAB — MAGNESIUM: Magnesium: 2.3 mg/dL (ref 1.7–2.4)

## 2021-07-19 LAB — PROTIME-INR
INR: 0.8 (ref 0.8–1.2)
Prothrombin Time: 11.2 seconds — ABNORMAL LOW (ref 11.4–15.2)

## 2021-07-19 MED ORDER — MORPHINE SULFATE (PF) 4 MG/ML IV SOLN
4.0000 mg | Freq: Once | INTRAVENOUS | Status: AC
  Administered 2021-07-19: 4 mg via INTRAVENOUS
  Filled 2021-07-19: qty 1

## 2021-07-19 MED ORDER — LIDOCAINE 5 % EX PTCH: 1.0000 | MEDICATED_PATCH | CUTANEOUS | 0 refills | Status: DC

## 2021-07-19 MED ORDER — CYCLOBENZAPRINE HCL 5 MG PO TABS
5.0000 mg | ORAL_TABLET | Freq: Once | ORAL | Status: AC
  Administered 2021-07-19: 5 mg via ORAL
  Filled 2021-07-19: qty 1

## 2021-07-19 MED ORDER — ONDANSETRON HCL 4 MG/2ML IJ SOLN
4.0000 mg | Freq: Once | INTRAMUSCULAR | Status: AC
  Administered 2021-07-19: 4 mg via INTRAVENOUS
  Filled 2021-07-19: qty 2

## 2021-07-19 MED ORDER — CYCLOBENZAPRINE HCL 5 MG PO TABS: 10.0000 mg | ORAL_TABLET | Freq: Two times a day (BID) | ORAL | 0 refills | Status: AC | PRN

## 2021-07-19 MED ORDER — IOHEXOL 350 MG/ML SOLN
75.0000 mL | Freq: Once | INTRAVENOUS | Status: AC | PRN
  Administered 2021-07-19: 75 mL via INTRAVENOUS

## 2021-07-19 MED ORDER — ONDANSETRON HCL 4 MG PO TABS: 4.0000 mg | ORAL_TABLET | Freq: Three times a day (TID) | ORAL | 0 refills | Status: DC | PRN

## 2021-07-19 NOTE — ED Provider Notes (Signed)
[facility] EMERGENCY DEPARTMENT Provider Note   CSN: [id number] Arrival date & time: 07/19/21  2006     History Chief Complaint  Patient presents with   Neck Pain    Zuzu A Sidell is a 70 y.o. female.  The history is provided by the patient, a relative and medical records. No language interpreter was used.  Neck Pain Pain location:  R side and occipital region Quality:  Stabbing and shooting Pain radiates to:  Head Pain severity:  Severe Pain is:  Same all the time Onset quality:  Sudden Duration:  6 hours Timing:  Constant Progression:  Worsening Chronicity:  New Context: not fall and not recent injury   Relieved by:  Nothing Worsened by:  Position Ineffective treatments:  None tried Associated symptoms: headaches   Associated symptoms: no chest pain, no fever, no leg pain, no numbness, no paresis, no photophobia, no syncope, no tingling, no visual change and no weakness   Risk factors: no hx of spinal trauma       Past Medical History:  Diagnosis Date   High cholesterol    Thyroid disease     There are no problems to display for this patient.   History reviewed. No pertinent surgical history.   OB History   No obstetric history on file.     No family history on file.  Social History   Tobacco Use   Smoking status: Never   Smokeless tobacco: Never  Vaping Use   Vaping Use: Never used  Substance Use Topics   Alcohol use: Never   Drug use: Never    Home Medications Prior to Admission medications   Not on File    Allergies    Quinine derivatives  Review of Systems   Review of Systems  Constitutional:  Negative for chills, diaphoresis, fatigue and fever.  HENT:  Negative for congestion.   Eyes:  Negative for photophobia and visual disturbance.  Respiratory:  Negative for cough, chest tightness, shortness of breath and wheezing.   Cardiovascular:  Negative for chest pain, palpitations and syncope.  Gastrointestinal:  Negative  for abdominal pain, constipation, diarrhea and nausea.  Genitourinary:  Negative for dysuria and flank pain.  Musculoskeletal:  Positive for neck pain. Negative for back pain.  Skin:  Negative for rash and wound.  Neurological:  Positive for headaches. Negative for dizziness, tingling, seizures, weakness, light-headedness and numbness.  Psychiatric/Behavioral:  Negative for agitation and confusion.   All other systems reviewed and are negative.  Physical Exam Updated Vital Signs BP (!) 157/82 (BP Location: Left Arm)   Pulse 85   Temp 98.2 F (36.8 C) (Oral)   Resp 20   Ht 5\' 5"  (1.651 m)   Wt 78.5 kg   SpO2 95%   BMI 28.79 kg/m   Physical Exam Vitals and nursing note reviewed.  Constitutional:      General: She is not in acute distress.    Appearance: She is well-developed. She is not ill-appearing, toxic-appearing or diaphoretic.  HENT:     Head: Normocephalic and atraumatic.     Nose: No congestion or rhinorrhea.     Mouth/Throat:     Mouth: Mucous membranes are moist.     Pharynx: No oropharyngeal exudate or posterior oropharyngeal erythema.  Eyes:     Extraocular Movements: Extraocular movements intact.     Conjunctiva/sclera: Conjunctivae normal.     Pupils: Pupils are equal, round, and reactive to light.  Neck:     Vascular: No  carotid bruit.   Cardiovascular:     Rate and Rhythm: Normal rate and regular rhythm.     Heart sounds: No murmur heard. Pulmonary:     Effort: Pulmonary effort is normal. No respiratory distress.     Breath sounds: Normal breath sounds. No wheezing, rhonchi or rales.  Chest:     Chest wall: No tenderness.  Abdominal:     General: Abdomen is flat.     Palpations: Abdomen is soft.     Tenderness: There is no abdominal tenderness. There is no guarding or rebound.  Musculoskeletal:        General: Tenderness present. No swelling.     Cervical back: Tenderness present. Pain with movement and muscular tenderness present. No spinous process  tenderness.  Skin:    General: Skin is warm and dry.     Capillary Refill: Capillary refill takes less than 2 seconds.     Findings: No erythema.  Neurological:     General: No focal deficit present.     Mental Status: She is alert and oriented to person, place, and time. Mental status is at baseline.     Cranial Nerves: No dysarthria or facial asymmetry.     Sensory: No sensory deficit.     Motor: No weakness or abnormal muscle tone.     Comments: Patient has pain and tenderness to her right posterior neck and lateral neck, into her occiput.  No carotid bruit appreciated.  No focal neurologic deficits initially with normal sensation and strength in extremities.   Pupils symmetric and reactive normal extraocular movements.  Psychiatric:        Mood and Affect: Mood normal.    ED Results / Procedures / Treatments   Labs (all labs ordered are listed, but only abnormal results are displayed) Labs Reviewed  CBC WITH DIFFERENTIAL/PLATELET - Abnormal; Notable for the following components:      Result Value   Monocytes Absolute 1.1 (*)    All other components within normal limits  COMPREHENSIVE METABOLIC PANEL - Abnormal; Notable for the following components:   Glucose, Bld 127 (*)    Creatinine, Ser 1.04 (*)    Total Bilirubin 0.2 (*)    GFR, Estimated 57 (*)    All other components within normal limits  PROTIME-INR - Abnormal; Notable for the following components:   Prothrombin Time 11.2 (*)    All other components within normal limits  RESP PANEL BY RT-PCR (FLU A&B, COVID) ARPGX2  MAGNESIUM    EKG None  Radiology No results found.  Procedures Procedures   Medications Ordered in ED Medications  morphine 4 MG/ML injection 4 mg (4 mg Intravenous Given 07/19/21 2054)  iohexol (OMNIPAQUE) 350 MG/ML injection 75 mL (75 mLs Intravenous Contrast Given 07/19/21 2137)  morphine 4 MG/ML injection 4 mg (4 mg Intravenous Given 07/19/21 2226)  ondansetron (ZOFRAN) injection 4 mg (4 mg  Intravenous Given 07/19/21 2355)  morphine 4 MG/ML injection 4 mg (4 mg Intravenous Given 07/19/21 2355)  cyclobenzaprine (FLEXERIL) tablet 5 mg (5 mg Oral Given 07/19/21 2354)    ED Course  I have reviewed the triage vital signs and the nursing notes.  Pertinent labs & imaging results that were available during my care of the patient were reviewed by me and considered in my medical decision making (see chart for details).    MDM Rules/Calculators/A&P  Shawanda A Valladares is a 70 y.o. female with a past medical history significant for thyroid disease and hypercholesterolemia who presents with severe right neck pain going into her head.  Around 4 PM, patient reports sudden onset of pain in the right back part of her neck goes into her head.  She reports it is been worsening for the last few hours.  She is never had this before.  She reports history of muscle spasms in her body in the past but never pain like this.  She denies any recent trauma, chiropractor use, or massage.  She denies any pain in her extremities and no neurologic complaints with no numbness, tingling, or weakness reported.  She does not describe any dizziness or visual changes.  No speech abnormalities.  Pain is 10 out of 10 and has been worsening since it began.  She denies any history of vascular disease or dissection in her neck and denies history of aneurysms.  She has no recent fevers, chills congestion, cough.  No history of meningitis.  On exam, lungs clear and chest nontender.  Abdomen nontender.  Back nontender.  Patient has tenderness in her right neck that goes into the occipital area.  No carotid bruit appreciated.  No focal neurologic deficits initially.  Pupils are symmetric and reactive and extract movements.  Symmetric smile.  Clear speech.  No evidence of Hoffmann sign on exam of bilateral hands.  Had a shared decision-making conversation with patient and daughter who is a Engineer, civil (consulting).  Given her  sudden onset of symptoms have been worsening, I do feel is appropriate to get imaging of her head and neck specifically to rule out dissection, aneurysm, or bleed.  Family is in agreement with this.  We will get screening labs and will also give her some pain medicine.  Daughter was given some options and felt that morphine was likely the best option for the patient.  We will order this.  She is not having any nausea.  Given her lack of fevers, chills, or any infectious symptoms, we agreed together we have a low suspicion for meningitis and do not feel lumbar puncture is needed at this time.  She did have some tenderness and what was felt to be possible muscle spasm.  We discussed that her work-up is totally reassuring and she is feeling better, this could be a muscle spasm causing her discomfort however we need to get imaging to rule out more concerning etiologies first and family agrees.  Anticipate reassessment after work-up.  CTA of the head and neck did not show any evidence of acute bleed, dissection, or acute stroke.  There was a likely unrelated infundibulum on the left ICA which we discussed and patient will follow-up for.  She is already feeling better after medications.  Suspect muscle spasm and soft tissue pains.  Had a long discussion with family and patient and we agreed to do muscle relaxant and Lidoderm patches and nausea medicine that she needs.  Patient will follow-up with PCP.  Patient and family agreed to plan of care and patient discharged in good condition after reassuring work-up otherwise.   Final Clinical Impression(s) / ED Diagnoses Final diagnoses:  Muscle spasm  Neck pain on right side  Nonintractable headache, unspecified chronicity pattern, unspecified headache type    Rx / DC Orders ED Discharge Orders          Ordered    cyclobenzaprine (FLEXERIL) 5 MG tablet  2 times daily PRN  07/19/21 2349    lidocaine (LIDODERM) 5 %  Every 24 hours        07/19/21 2349     ondansetron (ZOFRAN) 4 MG tablet  Every 8 hours PRN        07/19/21 2349            Clinical Impression: 1. Muscle spasm   2. Neck pain on right side   3. Nonintractable headache, unspecified chronicity pattern, unspecified headache type     Disposition: Discharge  Condition: Good  I have discussed the results, Dx and Tx plan with the pt(& family if present). He/she/they expressed understanding and agree(s) with the plan. Discharge instructions discussed at great length. Strict return precautions discussed and pt &/or family have verbalized understanding of the instructions. No further questions at time of discharge.    New Prescriptions   CYCLOBENZAPRINE (FLEXERIL) 5 MG TABLET    Take 2 tablets (10 mg total) by mouth 2 (two) times daily as needed for muscle spasms.   LIDOCAINE (LIDODERM) 5 %    Place 1 patch onto the skin daily. Remove & Discard patch within 12 hours or as directed by MD   ONDANSETRON (ZOFRAN) 4 MG TABLET    Take 1 tablet (4 mg total) by mouth every 8 (eight) hours as needed for nausea or vomiting.    Follow Up: Lowella Dandy, NP [place] [address] [phone]     [facility] [address] [id number] [place]       Yeilyn Gent, Gwenyth Allegra, MD 07/20/21 [id number]

## 2021-07-19 NOTE — Discharge Instructions (Signed)
Your history, exam, work-up today did not reveal any evidence of acute dissection, bleed, stroke, or aneurysm on the right side of your neck causing your symptoms tonight.  Your exam was consistent with muscle spasm and pain and given the reassuring imaging we feel you are safe for discharge home to treat the suspected muscle pains with patch, medication, and rest.  Please follow-up with your primary doctor for reassessment.  We did see the small abnormality on the left side which we did not feel is related but want you to follow-up with your PCP for this.  If any symptoms change or worsen acutely, please return to the nearest emergency department.

## 2021-07-19 NOTE — ED Triage Notes (Signed)
Per pt and daughter-pt with pain to right side of neck started ~4pm-states she went to [facility] and was advised to come to ED-denies injury-pt grimacing/holding right side of neck-c/o HA started ~20 min PTA-denies visual disturbance or weakness-EDP made aware and will see pt in triage

## 2021-07-19 NOTE — ED Notes (Signed)
Patient transported to CT 

## 2021-07-20 NOTE — ED Notes (Signed)
Discharge instructions discussed with pt and daughter/caregiver at bedside. Caregiver verbalized understanding with no questions at this time. Pt pending release, medication hold after morphine administration

## 2021-07-25 DIAGNOSIS — I679 Cerebrovascular disease, unspecified: Secondary | ICD-10-CM | POA: Diagnosis not present

## 2021-07-25 DIAGNOSIS — M542 Cervicalgia: Secondary | ICD-10-CM | POA: Diagnosis not present

## 2021-08-03 DIAGNOSIS — M542 Cervicalgia: Secondary | ICD-10-CM | POA: Diagnosis not present

## 2021-08-09 DIAGNOSIS — M542 Cervicalgia: Secondary | ICD-10-CM | POA: Diagnosis not present

## 2021-08-10 DIAGNOSIS — E785 Hyperlipidemia, unspecified: Secondary | ICD-10-CM | POA: Diagnosis not present

## 2021-08-10 DIAGNOSIS — Z9181 History of falling: Secondary | ICD-10-CM | POA: Diagnosis not present

## 2021-08-10 DIAGNOSIS — Z1331 Encounter for screening for depression: Secondary | ICD-10-CM | POA: Diagnosis not present

## 2021-08-10 DIAGNOSIS — Z0000 Encounter for general adult medical examination without abnormal findings: Secondary | ICD-10-CM | POA: Diagnosis not present

## 2021-08-15 DIAGNOSIS — M542 Cervicalgia: Secondary | ICD-10-CM | POA: Diagnosis not present

## 2021-08-22 DIAGNOSIS — M8589 Other specified disorders of bone density and structure, multiple sites: Secondary | ICD-10-CM | POA: Diagnosis not present

## 2021-08-22 DIAGNOSIS — M542 Cervicalgia: Secondary | ICD-10-CM | POA: Diagnosis not present

## 2021-08-22 DIAGNOSIS — R928 Other abnormal and inconclusive findings on diagnostic imaging of breast: Secondary | ICD-10-CM | POA: Diagnosis not present

## 2021-08-22 DIAGNOSIS — N390 Urinary tract infection, site not specified: Secondary | ICD-10-CM | POA: Diagnosis not present

## 2021-08-29 DIAGNOSIS — M542 Cervicalgia: Secondary | ICD-10-CM | POA: Diagnosis not present

## 2021-08-31 DIAGNOSIS — R251 Tremor, unspecified: Secondary | ICD-10-CM | POA: Diagnosis not present

## 2021-08-31 DIAGNOSIS — M542 Cervicalgia: Secondary | ICD-10-CM | POA: Diagnosis not present

## 2021-08-31 DIAGNOSIS — I679 Cerebrovascular disease, unspecified: Secondary | ICD-10-CM | POA: Diagnosis not present

## 2021-09-05 DIAGNOSIS — M542 Cervicalgia: Secondary | ICD-10-CM | POA: Diagnosis not present

## 2021-10-19 DIAGNOSIS — E785 Hyperlipidemia, unspecified: Secondary | ICD-10-CM | POA: Diagnosis not present

## 2021-10-19 DIAGNOSIS — E559 Vitamin D deficiency, unspecified: Secondary | ICD-10-CM | POA: Diagnosis not present

## 2021-10-19 DIAGNOSIS — F3340 Major depressive disorder, recurrent, in remission, unspecified: Secondary | ICD-10-CM | POA: Diagnosis not present

## 2021-10-19 DIAGNOSIS — Z6829 Body mass index (BMI) 29.0-29.9, adult: Secondary | ICD-10-CM | POA: Diagnosis not present

## 2021-10-19 DIAGNOSIS — M8580 Other specified disorders of bone density and structure, unspecified site: Secondary | ICD-10-CM | POA: Diagnosis not present

## 2021-10-19 DIAGNOSIS — Z139 Encounter for screening, unspecified: Secondary | ICD-10-CM | POA: Diagnosis not present

## 2021-10-19 DIAGNOSIS — E669 Obesity, unspecified: Secondary | ICD-10-CM | POA: Diagnosis not present

## 2021-10-19 DIAGNOSIS — R739 Hyperglycemia, unspecified: Secondary | ICD-10-CM | POA: Diagnosis not present

## 2021-10-19 DIAGNOSIS — E0590 Thyrotoxicosis, unspecified without thyrotoxic crisis or storm: Secondary | ICD-10-CM | POA: Diagnosis not present

## 2021-10-19 DIAGNOSIS — G4762 Sleep related leg cramps: Secondary | ICD-10-CM | POA: Diagnosis not present

## 2021-10-19 DIAGNOSIS — R000 Tachycardia, unspecified: Secondary | ICD-10-CM | POA: Diagnosis not present

## 2021-10-19 DIAGNOSIS — F411 Generalized anxiety disorder: Secondary | ICD-10-CM | POA: Diagnosis not present

## 2021-10-25 DIAGNOSIS — E0590 Thyrotoxicosis, unspecified without thyrotoxic crisis or storm: Secondary | ICD-10-CM | POA: Diagnosis not present

## 2021-10-25 DIAGNOSIS — E559 Vitamin D deficiency, unspecified: Secondary | ICD-10-CM | POA: Diagnosis not present

## 2021-10-25 DIAGNOSIS — E042 Nontoxic multinodular goiter: Secondary | ICD-10-CM | POA: Diagnosis not present

## 2021-10-26 ENCOUNTER — Encounter: Payer: Self-pay | Admitting: Neurology

## 2021-10-26 ENCOUNTER — Ambulatory Visit: Payer: PPO | Admitting: Neurology

## 2021-10-26 ENCOUNTER — Telehealth: Payer: Self-pay | Admitting: Neurology

## 2021-10-26 VITALS — BP 124/75 | HR 79 | Ht 65.0 in | Wt 173.0 lb

## 2021-10-26 DIAGNOSIS — G4733 Obstructive sleep apnea (adult) (pediatric): Secondary | ICD-10-CM | POA: Diagnosis not present

## 2021-10-26 DIAGNOSIS — M542 Cervicalgia: Secondary | ICD-10-CM | POA: Diagnosis not present

## 2021-10-26 DIAGNOSIS — R930 Abnormal findings on diagnostic imaging of skull and head, not elsewhere classified: Secondary | ICD-10-CM | POA: Diagnosis not present

## 2021-10-26 MED ORDER — DIAZEPAM 5 MG PO TABS: ORAL_TABLET | ORAL | 0 refills | Status: DC

## 2021-10-26 NOTE — Progress Notes (Signed)
? ?Chief Complaint  ?Patient presents with  ? New Patient (Initial Visit)  ?  Rm 15. Accompanied by daughter, Danford Bad. ?NP/Paper Proficient/[facility]/Amy Moon NP/tremors, infarcts found on CTA.  ? ? ? ? ?ASSESSMENT AND PLAN ? ?Kristin Figueroa is a 71 y.o. female   ?Acute onset of neck pain, radiating pain to right occipital region ? Most suggestive of right occipital neuralgia, much improved now with Flexeril as needed ? 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 CT cervical spine showed evidence of multilevel degenerative changes, ? She does complains of intermittent unsteady gait, worsening urinary incontinence, recurrent neck pain, hyperreflexia of bilateral upper and lower extremity ? Proceed with MRI of brain, MRI of cervical spine ?Obstructive sleep apnea ? Referred for sleep study ? ?Essential tremor ? Normal thyroid functional test, ? ?DIAGNOSTIC DATA (LABS, IMAGING, TESTING) ?- I reviewed patient records, labs, notes, testing and imaging myself where available. ? ? ?MEDICAL HISTORY: ? ?Kristin Figueroa is a 71 year old female, seen in request by primary care nurse practitioner Waynetta Sandy, Amy for evaluation of tremor, neck pain, abnormal CT scan of the brain, initial evaluation was with her daughter October 26, 2021 ? ?I reviewed and summarized the referring note. PMHx. ?HLD. ?Depression, anxiety ?Hyoothyroidism ? ?She presented to emergency room on July 19, 2021 after acute onset of right upper neck pain radiating to right occipital area, progressively worse from 4 PM to 7 PM, and persistent until she was at emergency room, receiving morphine shot, ? ?I personally reviewed CT angiogram of head and brain, no large vessel disease, radiology reports small vessel disease on CT head, lacunar infarction of bilateral basal ganglia ? ?CT cervical spine showed small central canal,  multilevel degenerative changes ? ?She does have intermittent neck pain, left radicular right upper neck pain episode last about 1 month, gradually resolved, still intermittent radiating pain to right occipital region, taking Flexeril as needed, she also complains of intermittent unsteady gait, slow worsening urinary incontinence over the past 2 years ? ?Since 2021, she was noted to have bilateral hand tremor, head titubation, most noticeable with writing, holding hands, no major limitation in her daily function ? ?There was no family history of tremor ? ?She had long history of loud snoring, now with frequent awakening at nighttime, excessive daytime sleepiness and fatigue ?  ? ?PHYSICAL EXAM: ?  ?Vitals:  ? 10/26/21 0826  ?BP: 124/75  ?Pulse: 79  ?Weight: 173 lb (78.5 kg)  ?Height: 5\' 5"  (1.651 m)  ? ?Not recorded ?  ? ? ?Body mass index is 28.79 kg/m?. ? ?PHYSICAL EXAMNIATION: ? ?Gen: NAD, conversant, well nourised, well groomed                     ?Cardiovascular: Regular rate rhythm, no peripheral edema, warm, nontender. ?Eyes: Conjunctivae clear without exudates or hemorrhage ?Neck: Supple, no carotid bruits. ?Pulmonary: Clear to auscultation bilaterally  ? ?NEUROLOGICAL EXAM: ? ?MENTAL STATUS: ?Speech: ?   Speech is normal; fluent and spontaneous with normal comprehension.  ?Cognition: ?    Orientation to time, place and person ?    Normal recent and remote memory ?    Normal Attention span and concentration ?    Normal Language, naming, repeating,spontaneous speech ?    Fund of knowledge ?  ?CRANIAL NERVES: ?CN II: Visual fields are full to confrontation. Pupils are  round equal and briskly reactive to light. ?CN III, IV, VI: extraocular movement are normal. No ptosis. ?CN V: Facial sensation is intact to light touch ?CN VII: Face is symmetric with normal eye closure  ?CN VIII: Hearing is normal to causal conversation. ?CN IX, X: Phonation is normal. ?CN XI: Head turning and shoulder shrug are intact ?CN XII:  Small jaw, very narrow oropharyngeal space, ? ?MOTOR: ?There is no pronator drift of out-stretched arms. Muscle bulk and tone are normal. Muscle strength is normal. ? ?REFLEXES: ?Reflexes are 2+ and symmetric at the biceps, triceps, 3/3 knees, and ankles. Plantar responses are flexor. ? ?SENSORY: ?Intact to light touch, pinprick and vibratory sensation are intact in fingers and toes. ? ?COORDINATION: ?There is no trunk or limb dysmetria noted. ? ?GAIT/STANCE: ?Need push-up to get up from seated position, cautious ? ?REVIEW OF SYSTEMS:  ?Full 14 system review of systems performed and notable only for as above ?All other review of systems were negative. ? ? ?ALLERGIES: ?Allergies  ?Allergen Reactions  ? Clonidine   ?  Other reaction(s): Other (See Comments)  ? Levofloxacin   ?  Other reaction(s): Other (See Comments)  ? Ofloxacin Nausea And Vomiting  ? Quinine Derivatives   ? ? ?HOME MEDICATIONS: ?Current Outpatient Medications  ?Medication Sig Dispense Refill  ? aspirin 81 MG EC tablet Take 1 tablet by mouth daily.    ? Calcium-Magnesium-Vitamin D (CALCIUM MAGNESIUM PO) Take 1 tablet by mouth daily.    ? cetirizine (ZYRTEC) 10 MG tablet Take 10 mg by mouth daily.    ? Cholecalciferol 50 MCG (2000 UT) CAPS Take 1 tablet by mouth daily.    ? cyclobenzaprine (FLEXERIL) 5 MG tablet Take 2 tablets (10 mg total) by mouth 2 (two) times daily as needed for muscle spasms. 20 tablet 0  ? diazepam (VALIUM) 5 MG tablet Take 5 mg by mouth daily as needed.    ? methimazole (TAPAZOLE) 5 MG tablet Take 5 mg by mouth daily.    ? metoprolol succinate (TOPROL-XL) 25 MG 24 hr tablet Take 25 mg by mouth daily.    ? simvastatin (ZOCOR) 40 MG tablet Take 1 tablet by mouth at bedtime.    ? venlafaxine XR (EFFEXOR-XR) 150 MG 24 hr capsule Take 1 capsule by mouth daily.    ? ?No current facility-administered medications for this visit.  ? ? ?PAST MEDICAL HISTORY: ?Past Medical History:  ?Diagnosis Date  ? High cholesterol   ? Thyroid disease    ? ? ?PAST SURGICAL HISTORY: ?History reviewed. No pertinent surgical history. ? ?FAMILY HISTORY: ?History reviewed. No pertinent family history. ? ?SOCIAL HISTORY: ?Social History  ? ?Socioeconomic History  ? Marital status: Married  ?  Spouse name: Not on file  ? Number of children: Not on file  ? Years of education: Not on file  ? Highest education level: Not on file  ?Occupational History  ? Not on file  ?Tobacco Use  ? Smoking status: Never  ? Smokeless tobacco: Never  ?Vaping Use  ? Vaping Use: Never used  ?Substance and Sexual Activity  ? Alcohol use: Never  ? Drug use: Never  ? Sexual activity: Not on file  ?Other Topics Concern  ? Not on file  ?Social History Narrative  ? Not on file  ? ?Social Determinants of Health  ? ?Financial Resource Strain: Not on file  ?Food Insecurity: Not on file  ?Transportation Needs: Not on file  ?Physical Activity: Not on file  ?Stress: Not  on file  ?Social Connections: Not on file  ?Intimate Partner Violence: Not on file  ? ? ? ? ?Levert FeinsteinYijun Brooksie Ellwanger, M.D. Ph.D. ? ?[facility] ?[address], [address] ?[place], [place] [address] ?Ph: [phone] ?Fax: [phone] ? ?CC:  Hurshel PartyMoon, Amy A, NP ?[address] ?[address] ?[place] [id number]  Hurshel PartyMoon, Amy A, NP   ?

## 2021-10-26 NOTE — Telephone Encounter (Signed)
HTA order sent to [facility], [facility] they will reach out to the patient to schedule.  

## 2021-10-31 DIAGNOSIS — E042 Nontoxic multinodular goiter: Secondary | ICD-10-CM | POA: Diagnosis not present

## 2021-11-08 ENCOUNTER — Encounter: Payer: Self-pay | Admitting: Neurology

## 2021-11-14 ENCOUNTER — Other Ambulatory Visit: Payer: PPO

## 2021-11-23 DIAGNOSIS — R928 Other abnormal and inconclusive findings on diagnostic imaging of breast: Secondary | ICD-10-CM | POA: Diagnosis not present

## 2021-11-23 DIAGNOSIS — R922 Inconclusive mammogram: Secondary | ICD-10-CM | POA: Diagnosis not present

## 2021-11-23 DIAGNOSIS — M85851 Other specified disorders of bone density and structure, right thigh: Secondary | ICD-10-CM | POA: Diagnosis not present

## 2021-11-23 DIAGNOSIS — M8589 Other specified disorders of bone density and structure, multiple sites: Secondary | ICD-10-CM | POA: Diagnosis not present

## 2021-12-01 DIAGNOSIS — J0100 Acute maxillary sinusitis, unspecified: Secondary | ICD-10-CM | POA: Diagnosis not present

## 2021-12-21 ENCOUNTER — Ambulatory Visit: Payer: PPO | Admitting: Neurology

## 2021-12-21 ENCOUNTER — Encounter: Payer: Self-pay | Admitting: Neurology

## 2021-12-21 VITALS — BP 138/83 | HR 82 | Ht 64.0 in | Wt 171.5 lb

## 2021-12-21 DIAGNOSIS — G4762 Sleep related leg cramps: Secondary | ICD-10-CM | POA: Diagnosis not present

## 2021-12-21 DIAGNOSIS — R002 Palpitations: Secondary | ICD-10-CM

## 2021-12-21 DIAGNOSIS — G4763 Sleep related bruxism: Secondary | ICD-10-CM | POA: Diagnosis not present

## 2021-12-21 DIAGNOSIS — R0683 Snoring: Secondary | ICD-10-CM | POA: Insufficient documentation

## 2021-12-21 DIAGNOSIS — G4719 Other hypersomnia: Secondary | ICD-10-CM | POA: Diagnosis not present

## 2021-12-21 DIAGNOSIS — R61 Generalized hyperhidrosis: Secondary | ICD-10-CM | POA: Diagnosis not present

## 2021-12-21 DIAGNOSIS — M542 Cervicalgia: Secondary | ICD-10-CM | POA: Diagnosis not present

## 2021-12-21 NOTE — Progress Notes (Signed)
? ? ?SLEEP MEDICINE CLINIC ?  ? ?Provider:  Melvyn Novasarmen  Jandel Patriarca, MD  ?Primary Care Physician:  Hurshel PartyMoon, Kristin A, NP ?[address] ?[place] [id number]  ? ?  ?Referring Provider: Levert FeinsteinYan, Yijun, Md ?[address] ?[address] ?[place],  [place] [id number]  ?  ?  ?    ?Chief Complaint according to patient   ?Patient presents with:  ?  ? New Patient (Initial Visit)  ?    Pt referred by Dr.Yan for loud snoring, frequent night waking, excessive daytime sleepiness and fatigue. No prior SS. Avg 6hrs of sleep. Wakes up 4-5x and 2 of those times she uses the bathroom.  12-21-2021  ?  ?  ?HISTORY OF PRESENT ILLNESS:  ?Kristin Figueroa is a 71 y.o. Caucasian female patient seen here upon referral on 12/21/2021 from Dr Terrace ArabiaYan.  ?Chief concern according to patient :   Pt referred by Dr.Yan for loud snoring, frequent night waking, excessive daytime sleepiness and fatigue. No prior SS. Avg 6hrs of sleep. Wakes up 4-5x and 2 of those times she uses the bathroom.  ?  ? Kristin Figueroa  has a past medical history of High cholesterol and Thyroid disease. She is seen by Dr Terrace ArabiaYan for cervicalgia. She reports frequent urinary tract infections.  ?  ?Sleep relevant medical history: Nocturia 1-4 times, , cervical spine DDD.  ? Family medical /sleep history:  ?daughter with Prader- Willi syndrome with OSA, insomnia, sleep eating and walkers.  ?  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  She sleeps with her dog.  she lives in a household with  spouse, 3 adult children- her youngest daughter accompanied her today, 4 grandchildren.  ?Tobacco use: none .  ETOH use : none , ?Caffeine intake in form of Coffee( 1 cup in AM) Soda( 1 soda a year) Tea ( /) or energy drinks. Regular exercise in form of walking.   ?  ?Sleep habits are as follows: The patient's dinner time is between 4 PM. The patient goes to bed at 9 PM with a snack, and continues to sleep for 5-6 hours, wakes for   bathroom breaks, the first time at 1 AM.   ?The preferred sleep position is left side, fetal position-, with the support of 1 pillow. Dreams are reportedly frequent/vivid.  ?6.30  AM is the usual rise time. The patient wakes up spontaneously. ? She reports not feeling refreshed or restored in AM, with symptoms such as dry mouth, neck stiffness, morning headaches, and residual fatigue.  ?Naps are taken frequently between 9.30-10 AM, and again after lunch-lasting from 30-60  minutes and are more refreshing than nocturnal sleep.  ?  ?Review of Systems: ?Out of a complete 14 system review, the patient complains of only the following symptoms, and all other reviewed systems are negative.:  ?Fatigue, sleepiness ,  ?Husband witnessed snoring but not apnea , fragmented sleep, hypersomnia.  ?  ?How likely are you to doze in the following situations: ?0 = not likely, 1 = slight chance, 2 = moderate chance, 3 = high chance ?  ?Sitting and Reading?2 ?Watching Television?2 ?Sitting inactive in a public place (theater or meeting)?2 ?As a passenger in a car for an hour without a break?1 ?Lying down in the afternoon when circumstances permit?3 ?Sitting and talking to someone?0 ?Sitting quietly after lunch without alcohol?3 ?In a car, while stopped for a few minutes in  traffic?0 ?  ?Total = 8-13/ 24 points  ? FSS endorsed at 38/ 63 points.  ? ?Social History  ? ?Socioeconomic History  ? Marital status: Married  ?  Spouse name: Kristin Figueroa  ? Number of children: 3  ? Years of education: Not on file  ? Highest education level: 11th grade  ?Occupational History  ? Not on file  ?Tobacco Use  ? Smoking status: Never  ? Smokeless tobacco: Never  ?Vaping Use  ? Vaping Use: Never used  ?Substance and Sexual Activity  ? Alcohol use: Never  ? Drug use: Never  ? Sexual activity: Not on file  ?Other Topics Concern  ? Not on file  ?Social History Narrative  ? Lives with husband  ? R handed  ? Caffeine: 1/2 C of coffee AM   ? ?Social Determinants of  Health  ? ?Financial Resource Strain: Not on file  ?Food Insecurity: Not on file  ?Transportation Needs: Not on file  ?Physical Activity: Not on file  ?Stress: Not on file  ?Social Connections: Not on file  ? ? ?History reviewed. No pertinent family history. Prader -Willi syndrome affecting one daughter, she has 2 daughters and one son.  ? ?Past Medical History:  ?Diagnosis Date  ? High cholesterol   ? Thyroid disease ?cervicalgia   ? ? ?History reviewed. No pertinent surgical history.  ? ?Current Outpatient Medications on File Prior to Visit  ?Medication Sig Dispense Refill  ? aspirin 81 MG EC tablet Take 1 tablet by mouth daily.    ? Calcium-Magnesium-Vitamin D (CALCIUM MAGNESIUM PO) Take 1 tablet by mouth daily.    ? cetirizine (ZYRTEC) 10 MG tablet Take 10 mg by mouth daily.    ? Cholecalciferol 50 MCG (2000 UT) CAPS Take 1 tablet by mouth daily.    ? cyclobenzaprine (FLEXERIL) 5 MG tablet Take 2 tablets (10 mg total) by mouth 2 (two) times daily as needed for muscle spasms. 20 tablet 0  ? diazepam (VALIUM) 5 MG tablet Take 5 mg by mouth daily as needed.    ? diazepam (VALIUM) 5 MG tablet Take 1-2 tablets 30 minutes prior to MRI, may repeat once as needed. Must have driver. 3 tablet 0  ? methimazole (TAPAZOLE) 5 MG tablet Take 5 mg by mouth daily.    ? metoprolol succinate (TOPROL-XL) 25 MG 24 hr tablet Take 25 mg by mouth daily.    ? simvastatin (ZOCOR) 40 MG tablet Take 1 tablet by mouth at bedtime.    ? venlafaxine XR (EFFEXOR-XR) 150 MG 24 hr capsule Take 1 capsule by mouth daily.    ? ?No current facility-administered medications on file prior to visit.  ? ? ?Allergies  ?Allergen Reactions  ? Clonidine   ?  Other reaction(s): Other (See Comments)  ? Levofloxacin   ?  Other reaction(s): Other (See Comments)  ? Ofloxacin Nausea And Vomiting  ? Quinine Derivatives   ? ? ?Physical exam: ? ?Today's Vitals  ? 12/21/21 0956  ?BP: 138/83  ?Pulse: 82  ?Weight: 171 lb 8 oz (77.8 kg)  ?Height: 5\' 4"  (1.626 m)  ? ?Body  mass index is 29.44 kg/m?.  ? ?Wt Readings from Last 3 Encounters:  ?12/21/21 171 lb 8 oz (77.8 kg)  ?10/26/21 173 lb (78.5 kg)  ?07/19/21 173 lb (78.5 kg)  ?  ? ?Ht Readings from Last 3 Encounters:  ?12/21/21 5\' 4"  (1.626 m)  ?10/26/21 5\' 5"  (1.651 m)  ?07/19/21 5\' 5"  (1.651 m)  ?  ?  ?  General: The patient is awake, alert and appears not in acute distress. The patient is well groomed. ?Head: Normocephalic, atraumatic. Neck is supple. Mallampati 3,  ?neck circumference:14.25 inches . Nasal airflow patent.   ?Retrognathia is not seen.  ?Dental status: biological, good status. Deferred.  ?Cardiovascular:  Regular rate and cardiac rhythm by pulse,  without distended neck veins. ?Respiratory: Lungs are clear to auscultation.  ?Skin:  Without evidence of ankle edema, or rash. ?Trunk: The patient's posture is erect. ?  ?Neurologic exam : ?The patient is awake and alert, oriented to place and time.   ?Memory subjective described as intact.  ?Attention span & concentration ability appears normal.  ?Speech is fluent,  without  dysarthria, dysphonia or aphasia.  ?Mood and affect are appropriate. ?  ?Cranial nerves: no loss of smell or taste reported  ?Pupils are equal and briskly reactive to light. Funduscopic exam deferred.  ?Extraocular movements in vertical and horizontal planes were intact and without nystagmus. No Diplopia. ?Visual fields by finger perimetry are intact. ?Hearing was impaired to soft voice and finger rubbing.   ? Facial sensation intact to fine touch. ? Facial motor strength is symmetric and tongue and uvula move midline.  ?Neck ROM : rotation, tilt and flexion extension were normal for age and shoulder shrug was symmetrical.  ?  ?Motor exam:  Symmetric bulk, tone and ROM.   ?Normal tone without cog -wheeling, symmetric grip strength but weak. ?  ?Sensory:  Fine touch, pinprick and vibration were normal.  ?Proprioception tested in the upper extremities was normal. ?  ?Coordination:  visible tremor in head  and hands, Rapid alternating movements in the fingers/hands were of normal speed.  ?The Finger-to-nose maneuver was showing  evidence of dysmetria on the left and  tremor. In both hands , titubation.  ?

## 2021-12-21 NOTE — Patient Instructions (Signed)
Sleep Apnea Sleep apnea is a condition in which breathing pauses or becomes shallow during sleep. People with sleep apnea usually snore loudly. They may have times when they gasp and stop breathing for 10 seconds or more during sleep. This may happen many times during the night. Sleep apnea disrupts your sleep and keeps your body from getting the rest that it needs. This condition can increase your risk of certain health problems, including: Heart attack. Stroke. Obesity. Type 2 diabetes. Heart failure. Irregular heartbeat. High blood pressure. The goal of treatment is to help you breathe normally again. What are the causes?  The most common cause of sleep apnea is a collapsed or blocked airway. There are three kinds of sleep apnea: Obstructive sleep apnea. This kind is caused by a blocked or collapsed airway. Central sleep apnea. This kind happens when the part of the brain that controls breathing does not send the correct signals to the muscles that control breathing. Mixed sleep apnea. This is a combination of obstructive and central sleep apnea. What increases the risk? You are more likely to develop this condition if you: Are overweight. Smoke. Have a smaller than normal airway. Are older. Are female. Drink alcohol. Take sedatives or tranquilizers. Have a family history of sleep apnea. Have a tongue or tonsils that are larger than normal. What are the signs or symptoms? Symptoms of this condition include: Trouble staying asleep. Loud snoring. Morning headaches. Waking up gasping. Dry mouth or sore throat in the morning. Daytime sleepiness and tiredness. If you have daytime fatigue because of sleep apnea, you may be more likely to have: Trouble concentrating. Forgetfulness. Irritability or mood swings. Personality changes. Feelings of depression. Sexual dysfunction. This may include loss of interest if you are female, or erectile dysfunction if you are female. How is this  diagnosed? This condition may be diagnosed with: A medical history. A physical exam. A series of tests that are done while you are sleeping (sleep study). These tests are usually done in a sleep lab, but they may also be done at home. How is this treated? Treatment for this condition aims to restore normal breathing and to ease symptoms during sleep. It may involve managing health issues that can affect breathing, such as high blood pressure or obesity. Treatment may include: Sleeping on your side. Using a decongestant if you have nasal congestion. Avoiding the use of depressants, including alcohol, sedatives, and narcotics. Losing weight if you are overweight. Making changes to your diet. Quitting smoking. Using a device to open your airway while you sleep, such as: An oral appliance. This is a custom-made mouthpiece that shifts your lower jaw forward. A continuous positive airway pressure (CPAP) device. This device blows air through a mask when you breathe out (exhale). A nasal expiratory positive airway pressure (EPAP) device. This device has valves that you put into each nostril. A bi-level positive airway pressure (BIPAP) device. This device blows air through a mask when you breathe in (inhale) and breathe out (exhale). Having surgery if other treatments do not work. During surgery, excess tissue is removed to create a wider airway. Follow these instructions at home: Lifestyle Make any lifestyle changes that your health care provider recommends. Eat a healthy, well-balanced diet. Take steps to lose weight if you are overweight. Avoid using depressants, including alcohol, sedatives, and narcotics. Do not use any products that contain nicotine or tobacco. These products include cigarettes, chewing tobacco, and vaping devices, such as e-cigarettes. If you need help quitting, ask   your health care provider. ?General instructions ?Take over-the-counter and prescription medicines only as told  by your health care provider. ?If you were given a device to open your airway while you sleep, use it only as told by your health care provider. ?If you are having surgery, make sure to tell your health care provider you have sleep apnea. You may need to bring your device with you. ?Keep all follow-up visits. This is important. ?Contact a health care provider if: ?The device that you received to open your airway during sleep is uncomfortable or does not seem to be working. ?Your symptoms do not improve. ?Your symptoms get worse. ?Get help right away if: ?You develop: ?Chest pain. ?Shortness of breath. ?Discomfort in your back, arms, or stomach. ?You have: ?Trouble speaking. ?Weakness on one side of your body. ?Drooping in your face. ?These symptoms may represent a serious problem that is an emergency. Do not wait to see if the symptoms will go away. Get medical help right away. Call your local emergency services (911 in the [place] Do not drive yourself to the hospital. ?Summary ?Sleep apnea is a condition in which breathing pauses or becomes shallow during sleep. ?The most common cause is a collapsed or blocked airway. ?The goal of treatment is to restore normal breathing and to ease symptoms during sleep. ?This information is not intended to replace advice given to you by your health care provider. Make sure you discuss any questions you have with your health care provider. ?Document Revised: 03/08/2021 Document Reviewed: 07/08/2020 ?Elsevier Patient Education ? 2023 Elsevier Inc. ?Quality Sleep Information, Adult ?Quality sleep is important for your mental and physical health. It also improves your quality of life. Quality sleep means you: ?Are asleep for most of the time you are in bed. ?Fall asleep within 30 minutes. ?Wake up no more than once a night.  ?Are awake for no longer than 20 minutes if you do wake up during the night. ?Most adults need 7-8 hours of quality sleep each night. ?How can poor sleep affect  me? ?If you do not get enough quality sleep, you may have: ?Mood swings. ?Daytime sleepiness. ?Confusion. ?Decreased reaction time. ?Sleep disorders, such as insomnia and sleep apnea. ?Difficulty with: ?Solving problems. ?Coping with stress. ?Paying attention. ?These issues may affect your performance and productivity at work, school, and at home. Lack of sleep may also put you at higher risk for accidents, suicide, and risky behaviors. ?If you do not get quality sleep you may also be at higher risk for several health problems, including: ?Infections. ?Type 2 diabetes. ?Heart disease. ?High blood pressure. ?Obesity. ?Worsening of long-term conditions, like arthritis, kidney disease, depression, Parkinson's disease, and epilepsy. ?What actions can I take to get more quality sleep? ? ?  ? ?Stick to a sleep schedule. Go to sleep and wake up at about the same time each day. Do not try to sleep less on weekdays and make up for lost sleep on weekends. This does not work. ?Try to get about 30 minutes of exercise on most days. Do not exercise 2-3 hours before going to bed. ?Limit naps during the day to 30 minutes or less. ?Do not use any products that contain nicotine or tobacco, such as cigarettes or e-cigarettes. If you need help quitting, ask your health care provider. ?Do not drink caffeinated beverages for at least 8 hours before going to bed. Coffee, tea, and some sodas contain caffeine. ?Do not drink alcohol close to bedtime. ?Do not eat large  meals close to bedtime. ?Do not take naps in the late afternoon. ?Try to get at least 30 minutes of sunlight every day. Morning sunlight is best. ?Make time to relax before bed. Reading, listening to music, or taking a hot bath promotes quality sleep. ?Make your bedroom a place that promotes quality sleep. Keep your bedroom dark, quiet, and at a comfortable room temperature. Make sure your bed is comfortable. Take out sleep distractions like TV, a computer, smartphone, and  bright lights. ?If you are lying awake in bed for longer than 20 minutes, get up and do a relaxing activity until you feel sleepy. ?Work with your health care provider to treat medical conditions that may

## 2021-12-26 ENCOUNTER — Telehealth: Payer: Self-pay | Admitting: Neurology

## 2021-12-26 NOTE — Telephone Encounter (Signed)
HTA pending faxed notes 

## 2022-01-04 NOTE — Telephone Encounter (Signed)
Health team auth: [id number] (exp. 12/26/21 to 03/26/22). Patient is scheduled at [facility] for 01/09/22 at 9pm.

## 2022-01-06 DIAGNOSIS — N3090 Cystitis, unspecified without hematuria: Secondary | ICD-10-CM | POA: Diagnosis not present

## 2022-01-09 ENCOUNTER — Ambulatory Visit (INDEPENDENT_AMBULATORY_CARE_PROVIDER_SITE_OTHER): Payer: PPO | Admitting: Neurology

## 2022-01-09 DIAGNOSIS — R61 Generalized hyperhidrosis: Secondary | ICD-10-CM

## 2022-01-09 DIAGNOSIS — G4762 Sleep related leg cramps: Secondary | ICD-10-CM

## 2022-01-09 DIAGNOSIS — G4763 Sleep related bruxism: Secondary | ICD-10-CM

## 2022-01-09 DIAGNOSIS — R0683 Snoring: Secondary | ICD-10-CM

## 2022-01-09 DIAGNOSIS — R002 Palpitations: Secondary | ICD-10-CM

## 2022-01-09 DIAGNOSIS — M542 Cervicalgia: Secondary | ICD-10-CM

## 2022-01-09 DIAGNOSIS — G4719 Other hypersomnia: Secondary | ICD-10-CM

## 2022-01-22 ENCOUNTER — Telehealth: Payer: Self-pay | Admitting: Neurology

## 2022-01-22 ENCOUNTER — Encounter: Payer: Self-pay | Admitting: Neurology

## 2022-01-22 NOTE — Telephone Encounter (Signed)
Attempted to call the patient SSR. There was no answer and VM was full. Will send a mychart message to the pt.

## 2022-01-22 NOTE — Telephone Encounter (Signed)
-----   Message from Melvyn Novas, MD sent at 01/22/2022 11:21 AM EDT ----- IMPRESSION: Main findings were hypoxia, and Limb Movements. 1. No evidence of clinically relevant Central or Obstructive Sleep Apnea (OSA) and only mild snoring was recorded. 2. 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 3. Prolonged hypoxia for 35 minutes during sleep, and hypoxia was present before sleep onset as well. One liter of oxygen was applied after 5 consecutive minutes of hypoxia- at midnight and achieved incomplete control of hypoxia.    RECOMMENDATIONS:   1. Oxygen supplementation was needed- the hypoxia finding can explain the morning headaches and fatigue. Since hypoxia was present before sleep onset, this is not an apnea related hypoxemia and would need to be evaluated by General  Medicine, Pulmonology or Cardiology.              There is no need for CPAP.  2. PLMs ( Periodic Limb Movements ) can be related to Effexor medication. PCP: Please check for iron metabolism

## 2022-01-22 NOTE — Procedures (Signed)
PATIENT'S NAME:  Kristin, Figueroa DOB:      [birth year]      MR#:    [id number]     DATE OF RECORDING: 01/09/2022 REFERRING M.D.:  Levert Feinstein, MD Study Performed:   Baseline Polysomnogram HISTORY:  Kristin Figueroa is a 71 y.o. Caucasian female patient seen here upon referral on 12/21/2021 from Dr Terrace Arabia.  Chief concern:   This patient was referred by Dr. Terrace Arabia for loud snoring, frequent night waking, excessive daytime sleepiness and fatigue. No prior SS. Avg 6hrs of sleep. Wakes up 4-5x and 2 of those times she uses the bathroom.  Kristin Figueroa has a past medical history of High cholesterol and Thyroid disease. She is seen by Dr Terrace Arabia for cervicalgia. She also has a mild head and limb tremor and dysphonia, hyperthyroidism, leg cramps.  She reports frequent urinary tract infections.   The patient endorsed the Epworth Sleepiness Scale at 13/24 points and the FSS at 38/63 points.   The patient's weight 171 pounds with a height of 64 (inches), resulting in a BMI of 29.4 kg/m2. The patient's neck circumference measured 14.2 inches.  CURRENT MEDICATIONS: Aspirin, Zyrtec, Flexeril, Valium, Tapazole, Toprol-XL, Calcium Magnesium, Cholecalciferol, Zocor, Effexor-XR   PROCEDURE:  This is a multichannel digital polysomnogram utilizing the Somnostar 11.2 system.  Electrodes and sensors were applied and monitored per AASM Specifications.   EEG, EOG, Chin and Limb EMG, were sampled at 200 Hz.  ECG, Snore and Nasal Pressure, Thermal Airflow, Respiratory Effort, CPAP Flow and Pressure, Oximetry was sampled at 50 Hz. Digital video and audio were recorded.      BASELINE STUDY: Lights Out was at 21:48 and Lights On at 04:46.  Total recording time (TRT) was 418.5 minutes, with a total sleep time (TST) of 263 minutes.   The patient's sleep latency was 91.5 minutes.  REM latency was 285 minutes.  The sleep efficiency was 62.8 %.     SLEEP ARCHITECTURE: WASO (Wake after sleep onset) was 23 minutes.  There were 10.5 minutes in  Stage N1, 190 minutes Stage N2, 61.5 minutes Stage N3 and 1 minutes in Stage REM.   The percentage of Stage N1 was 4.%, Stage N2 was 72.2%, Stage N3 was 23.4% and Stage R (REM sleep) was only 0.4%.   RESPIRATORY ANALYSIS:  There were a total of 2 respiratory events:  0 obstructive apneas, 1 central apnea and 0 mixed apneas with 1 hypopnea. The patient also had 0 respiratory event related arousals (RERAs).     The total APNEA/HYPOPNEA INDEX (AHI) was 0.5/hour.  0 events occurred in REM sleep and 3 events in NREM. The REM AHI was 0 /hour, versus a non-REM AHI of 0.5/h. The patient spent 54.5 minutes of total sleep time in the supine position and 209 minutes in non-supine. The supine AHI was 1.1 versus a non-supine AHI of 0.3.  OXYGEN SATURATION & C02:  The Wake baseline 02 saturation was 94%, with the lowest being 85%. Time spent below 89% saturation equaled 35 minutes.  The arousals were noted as: 50 were spontaneous, 3 were associated with PLMs, 0 were associated with respiratory events.   PERIODIC LIMB MOVEMENTS:   The patient had a total of 373 Periodic Limb Movements.  The Periodic Limb Movement (PLM) Arousal index was 0.7/hour. Audio and video analysis did not show any abnormal or unusual movements, behaviors, phonations or vocalizations.   EKG was in keeping with normal sinus rhythm (NSR).  IMPRESSION: Main findings were hypoxia, and  Limb Movements. No evidence of clinically relevant Central or Obstructive Sleep Apnea (OSA) and only mild snoring was recorded. 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 Prolonged hypoxia for 35 minutes during sleep, and hypoxia was present before sleep onset as well. One liter of oxygen was applied after 5 consecutive minutes of hypoxia- at midnight and achieved incomplete control of hypoxia.    RECOMMENDATIONS:   Oxygen was needed- hypoxia can explain the morning headaches. Since hypoxia was present before sleep  onset, this is not an apnea related hypoxemia and would need to be evaluated by General Internal Medicine, Pulmonology or Cardiology.              There is no need for CPAP.  PLMs ( Periodic Limb Movements ) can be related to Effexor medication. Please check for iron metabolism and for signs of Parkinsonism.     I certify that I have reviewed the entire raw data recording prior to the issuance of this report in accordance with the Standards of Accreditation of the American Academy of Sleep Medicine (AASM)  Melvyn Novas, MD Diplomat, American Board of Psychiatry and Neurology  Diplomat, American Board of Sleep Medicine Wellsite geologist, [facility] [employer]

## 2022-02-02 DIAGNOSIS — G4734 Idiopathic sleep related nonobstructive alveolar hypoventilation: Secondary | ICD-10-CM | POA: Diagnosis not present

## 2022-02-02 DIAGNOSIS — G4762 Sleep related leg cramps: Secondary | ICD-10-CM | POA: Diagnosis not present

## 2022-02-02 DIAGNOSIS — Z79899 Other long term (current) drug therapy: Secondary | ICD-10-CM | POA: Diagnosis not present

## 2022-02-02 DIAGNOSIS — R0902 Hypoxemia: Secondary | ICD-10-CM | POA: Diagnosis not present

## 2022-02-11 DIAGNOSIS — H6122 Impacted cerumen, left ear: Secondary | ICD-10-CM | POA: Diagnosis not present

## 2022-02-11 DIAGNOSIS — H6692 Otitis media, unspecified, left ear: Secondary | ICD-10-CM | POA: Diagnosis not present

## 2022-03-09 DIAGNOSIS — H6092 Unspecified otitis externa, left ear: Secondary | ICD-10-CM | POA: Diagnosis not present

## 2022-03-11 ENCOUNTER — Encounter (HOSPITAL_BASED_OUTPATIENT_CLINIC_OR_DEPARTMENT_OTHER): Payer: Self-pay | Admitting: Emergency Medicine

## 2022-03-11 ENCOUNTER — Other Ambulatory Visit: Payer: Self-pay

## 2022-03-11 ENCOUNTER — Emergency Department (HOSPITAL_BASED_OUTPATIENT_CLINIC_OR_DEPARTMENT_OTHER)
Admission: EM | Admit: 2022-03-11 | Discharge: 2022-03-11 | Disposition: A | Discharge status: Home / Self Care | Payer: PPO | Attending: Emergency Medicine | Admitting: Emergency Medicine

## 2022-03-11 DIAGNOSIS — H60503 Unspecified acute noninfective otitis externa, bilateral: Secondary | ICD-10-CM

## 2022-03-11 DIAGNOSIS — H9203 Otalgia, bilateral: Secondary | ICD-10-CM | POA: Diagnosis not present

## 2022-03-11 DIAGNOSIS — Z7982 Long term (current) use of aspirin: Secondary | ICD-10-CM | POA: Diagnosis not present

## 2022-03-11 DIAGNOSIS — R000 Tachycardia, unspecified: Secondary | ICD-10-CM | POA: Diagnosis not present

## 2022-03-11 DIAGNOSIS — H60539 Acute contact otitis externa, unspecified ear: Secondary | ICD-10-CM | POA: Diagnosis not present

## 2022-03-11 DIAGNOSIS — H6020 Malignant otitis externa, unspecified ear: Secondary | ICD-10-CM | POA: Diagnosis not present

## 2022-03-11 DIAGNOSIS — H9209 Otalgia, unspecified ear: Secondary | ICD-10-CM | POA: Diagnosis present

## 2022-03-11 MED ORDER — CIPROFLOXACIN-DEXAMETHASONE 0.3-0.1 % OT SUSP: 4.0000 [drp] | Freq: Two times a day (BID) | OTIC | 0 refills | Status: DC

## 2022-03-11 MED ORDER — CIPROFLOXACIN-DEXAMETHASONE 0.3-0.1 % OT SUSP
4.0000 [drp] | Freq: Once | OTIC | Status: AC
  Administered 2022-03-11: 4 [drp] via OTIC
  Filled 2022-03-11: qty 7.5

## 2022-03-11 MED ORDER — CIPROFLOXACIN-DEXAMETHASONE 0.3-0.1 % OT SUSP: 4.0000 [drp] | Freq: Two times a day (BID) | OTIC | 0 refills | Status: AC

## 2022-03-11 MED ORDER — ONDANSETRON 4 MG PO TBDP
4.0000 mg | ORAL_TABLET | Freq: Once | ORAL | Status: AC
Start: 2022-03-11 — End: 2022-03-11
  Administered 2022-03-11: 4 mg via ORAL
  Filled 2022-03-11: qty 1

## 2022-03-11 NOTE — ED Provider Notes (Signed)
[facility] EMERGENCY DEPARTMENT Provider Note   CSN: [id number] Arrival date & time: 03/11/22  1645     History  Chief Complaint  Patient presents with   Otalgia    Kristin Figueroa is a 71 y.o. female presenting today with otalgia.  On 7/28 she was seen at urgent care for left-sided ear discomfort.  She was diagnosed with otitis externa and started on neomycin/polymyxin/hydrocortisone otic drops.  She went back today because she was starting to have discomfort in her right ear as well.  They did another exam and were concerned for malignant otitis externa and sent her to the emergency department.  Patient denies any fevers, trismus, discharge from ears, headache, history of diabetes.  No tinnitus or hearing disturbance.    Otalgia      Home Medications Prior to Admission medications   Medication Sig Start Date End Date Taking? Authorizing Provider  aspirin 81 MG EC tablet Take 1 tablet by mouth daily.    [provider]  Calcium-Magnesium-Vitamin D (CALCIUM MAGNESIUM PO) Take 1 tablet by mouth daily.    [provider]  cetirizine (ZYRTEC) 10 MG tablet Take 10 mg by mouth daily.    [provider]  Cholecalciferol 50 MCG (2000 UT) CAPS Take 1 tablet by mouth daily.    [provider]  cyclobenzaprine (FLEXERIL) 5 MG tablet Take 2 tablets (10 mg total) by mouth 2 (two) times daily as needed for muscle spasms. 07/19/21   Tegeler, Canary Brim, MD  diazepam (VALIUM) 5 MG tablet Take 5 mg by mouth daily as needed. 10/19/21   [provider]  diazepam (VALIUM) 5 MG tablet Take 1-2 tablets 30 minutes prior to MRI, may repeat once as needed. Must have driver. 10/26/21   Levert Feinstein, MD  methimazole (TAPAZOLE) 5 MG tablet Take 5 mg by mouth daily. 10/20/21   [provider]  metoprolol succinate (TOPROL-XL) 25 MG 24 hr tablet Take 25 mg by mouth daily. 09/04/21   [provider]  simvastatin (ZOCOR) 40 MG tablet Take 1  tablet by mouth at bedtime. 09/24/16   [provider]  venlafaxine XR (EFFEXOR-XR) 150 MG 24 hr capsule Take 1 capsule by mouth daily. 09/22/16   [provider]      Allergies    Clonidine, Levofloxacin, Ofloxacin, and Quinine derivatives    Review of Systems   Review of Systems  HENT:  Positive for ear pain.     Physical Exam Updated Vital Signs BP 127/79 (BP Location: Left Arm)   Pulse (!) 117   Temp 98.6 F (37 C) (Oral)   Resp 18   Wt 77.1 kg   SpO2 98%   BMI 29.18 kg/m  Physical Exam Vitals and nursing note reviewed.  Constitutional:      Appearance: Normal appearance.  HENT:     Head: Normocephalic and atraumatic.     Right Ear: External ear normal.     Left Ear: External ear normal.     Ears:     Comments: No mastoid tenderness bilaterally.  Mild tragal tenderness bilaterally.  Difficulty visualizing left-sided TM secondary to swelling.  Right TM with small rupture.  No drainage bilaterally.  No foreign bodies. Eyes:     General: No scleral icterus.    Conjunctiva/sclera: Conjunctivae normal.  Pulmonary:     Effort: Pulmonary effort is normal. No respiratory distress.  Skin:    Findings: No rash.  Neurological:     Mental Status: She is  alert.     Comments: Facial nerve still intact.  Psychiatric:        Mood and Affect: Mood normal.     ED Results / Procedures / Treatments   Labs (all labs ordered are listed, but only abnormal results are displayed) Labs Reviewed - No data to display  EKG None  Radiology No results found.  Procedures Procedures   Medications Ordered in ED Medications  ondansetron (ZOFRAN-ODT) disintegrating tablet 4 mg (4 mg Oral Given 03/11/22 1858)  ciprofloxacin-dexamethasone (CIPRODEX) 0.3-0.1 % OTIC (EAR) suspension 4 drop (4 drops Both EARS Given 03/11/22 1919)    ED Course/ Medical Decision Making/ A&P                           Medical Decision Making Risk Prescription drug  management.   71 year old female who presented to the emergency department at the request of urgent care for bilateral otalgia.  There was a concern for malignant otitis externa.  Differential includes but is not limited to MOE, cholesteatoma, otitis media, herpes zoster, TM rupture, otomycosis, mastoiditis   This is not an exhaustive differential.    Past Medical History / Co-morbidities / Social History: Hyperthyroidism   Additional history: Patient brought to urgent care notes with her.  They were concerned that it looked like there may be a fungal infection or mastoiditis.   Physical Exam: Bilateral tragal tenderness.  No mastoid tenderness.  No facial nerve involvement  Lab Tests: Considered lab work due to the urgent care concern for sepsis.  Patient was tachycardic on arrival however she reports she thinks this is due to anxiety.  Not tachycardic on auscultation   Imaging Studies: Consider CTA or sending the patient to a larger center for MRI however it is only been 2 days and the physical exam is largely benign.     Medications: I ordered medication including ciprodex.  She reports after putting these drops in with earwax she immediately felt better, especially in her right ear   Disposition: 71 year old female presenting with bilateral otalgia.  Sent from urgent care with concern for MOE.  Patient is without trismus, cranial nerve involvement, severe otalgia or other concerning findings.  She also has no risk factors for this.  She has only been on antibiotic drops for 2 days and it would be likely for MOE to develop that quickly without risk factors.  At this time I believe we need to up her antibiotic drops to Ciprodex to use in both of her ears.  She and her husband are agreeable to this.  She will also be given follow-up with ENT.  Return precautions discussed as well as signs and symptoms to be concerned about.  They voiced understanding.  Discharged with ENT follow-up.      I discussed this case with my attending physician Dr. Wallace Cullens who cosigned this note including patient's presenting symptoms, physical exam, and planned diagnostics and interventions. Attending physician stated agreement with plan or made changes to plan which were implemented.     Final Clinical Impression(s) / ED Diagnoses Final diagnoses:  Acute otitis externa of both ears, unspecified type    Rx / DC Orders ED Discharge Orders          Ordered    ciprofloxacin-dexamethasone (CIPRODEX) OTIC suspension  2 times daily,   Status:  Discontinued        03/11/22 1949    ciprofloxacin-dexamethasone (CIPRODEX) OTIC suspension  2 times daily  03/11/22 1957           Results and diagnoses were explained to the patient. Return precautions discussed in full. Patient had no additional questions and expressed complete understanding.   This chart was dictated using voice recognition software.  Despite best efforts to proofread,  errors can occur which can change the documentation meaning.     Saddie Benders, PA-C 03/13/22 1557    Sloan Leiter, DO 03/18/22 [phone]

## 2022-03-11 NOTE — ED Triage Notes (Signed)
Pt c/o bilateral ear pain today. Tx at [facility] last Friday for left ear pain. Endorses low grade fever

## 2022-03-11 NOTE — Discharge Instructions (Addendum)
Follow-up with the ENT doctor attached to these discharge papers later this week if things are not improving.  I sent another bottle of the eardrops to your pharmacy for you to use if you run out.  You may use these drops in the morning and at night.  After the drops are done, leave the ear wicks inside of the ears.  They will follow out on their own.  If they do not, you may have them removed by the ENT doctor on these papers during your visit later this week.  Read the information about external ear infections and return with any worsening symptoms.

## 2022-03-24 ENCOUNTER — Telehealth (HOSPITAL_BASED_OUTPATIENT_CLINIC_OR_DEPARTMENT_OTHER): Payer: Self-pay | Admitting: Emergency Medicine

## 2022-04-26 DIAGNOSIS — M8580 Other specified disorders of bone density and structure, unspecified site: Secondary | ICD-10-CM | POA: Diagnosis not present

## 2022-04-26 DIAGNOSIS — R739 Hyperglycemia, unspecified: Secondary | ICD-10-CM | POA: Diagnosis not present

## 2022-04-26 DIAGNOSIS — F411 Generalized anxiety disorder: Secondary | ICD-10-CM | POA: Diagnosis not present

## 2022-04-26 DIAGNOSIS — F3341 Major depressive disorder, recurrent, in partial remission: Secondary | ICD-10-CM | POA: Diagnosis not present

## 2022-04-26 DIAGNOSIS — M542 Cervicalgia: Secondary | ICD-10-CM | POA: Diagnosis not present

## 2022-04-26 DIAGNOSIS — I679 Cerebrovascular disease, unspecified: Secondary | ICD-10-CM | POA: Diagnosis not present

## 2022-04-26 DIAGNOSIS — E559 Vitamin D deficiency, unspecified: Secondary | ICD-10-CM | POA: Diagnosis not present

## 2022-04-26 DIAGNOSIS — E785 Hyperlipidemia, unspecified: Secondary | ICD-10-CM | POA: Diagnosis not present

## 2022-04-26 DIAGNOSIS — G4762 Sleep related leg cramps: Secondary | ICD-10-CM | POA: Diagnosis not present

## 2022-04-26 DIAGNOSIS — E669 Obesity, unspecified: Secondary | ICD-10-CM | POA: Diagnosis not present

## 2022-04-26 DIAGNOSIS — R000 Tachycardia, unspecified: Secondary | ICD-10-CM | POA: Diagnosis not present

## 2022-04-26 DIAGNOSIS — E0590 Thyrotoxicosis, unspecified without thyrotoxic crisis or storm: Secondary | ICD-10-CM | POA: Diagnosis not present

## 2022-05-08 ENCOUNTER — Ambulatory Visit (INDEPENDENT_AMBULATORY_CARE_PROVIDER_SITE_OTHER): Payer: PPO

## 2022-05-08 ENCOUNTER — Ambulatory Visit: Payer: PPO | Admitting: Pulmonary Disease

## 2022-05-08 ENCOUNTER — Encounter: Payer: Self-pay | Admitting: Pulmonary Disease

## 2022-05-08 VITALS — BP 110/60 | HR 86 | Ht 64.5 in | Wt 170.0 lb

## 2022-05-08 DIAGNOSIS — R0600 Dyspnea, unspecified: Secondary | ICD-10-CM | POA: Diagnosis not present

## 2022-05-08 DIAGNOSIS — R0902 Hypoxemia: Secondary | ICD-10-CM | POA: Diagnosis not present

## 2022-05-08 NOTE — Progress Notes (Signed)
Pulmonary, Critical Care, and Sleep Medicine  Chief Complaint  Patient presents with   Consult    O2 drops during sleep.    Past Surgical History:  She  has no past surgical history on file.  Past Medical History:  Multinodular thyroid, Hyperthyroidism, Tremors, Vit D deficiency, HLD, Depression, Osteopenia, Anxiety  Constitutional:  BP 110/60 (BP Location: Left Arm)   Pulse 86   Ht 5' 4.5" (1.638 m)   Wt 170 lb (77.1 kg)   SpO2 94%   BMI 28.73 kg/m   Brief Summary:  Kristin Figueroa is a 71 y.o. female with nocturnal hypoxemia.      Subjective:   She is here with her daughter.  She was seen at [facility] for tremor.  Noted to have sleep difficulties.  She has a sleep study in May that showed low oxygen, but was negative for sleep apnea.  She did an overnight oximetry in July that again showed low oxygen.  She had a death in the family, and wasn't able to follow up on this test result until now.  She does not have supplemental oxygen at home.  She doesn't feel like she has difficulty breathing while walking.  No history of asthma.  She had second hand tobacco exposure from her first husband.  No family history of lung disease.  No history of pneumonia or TB.  She had COVID a few times.  No history of asthma.  Denies history of thromboembolic disease.  Denies chest pain, cough, wheeze, sputum, fever, weight loss, joint swelling, leg swelling, or skin rash.  Physical Exam:   Appearance - well kempt   ENMT - no sinus tenderness, no oral exudate, no LAN, Mallampati 3 airway, no stridor  Respiratory - equal breath sounds bilaterally, no wheezing or rales  CV - s1s2 regular rate and rhythm, no murmurs  Ext - no clubbing, no edema  Skin - no rashes  Psych - normal mood and affect   Pulmonary testing:    Chest Imaging:    Sleep Tests:  PSG 01/09/22 >> AHI 0.5, SpO2 low 85%.  Spent 35 min with SpO2 < 89% ONO with RA 02/13/22 >> test time 9 hrs 31  min.  Basal SpO2 90.5% and SpO2 low 79%.  Spent 1 hr 55 min with SpO2 < 88%  Cardiac Tests:    Social History:  She  reports that she has never smoked. She has never used smokeless tobacco. She reports that she does not drink alcohol and does not use drugs.  Family History:  Her family history includes Prader-Willi syndrome in her daughter.    Discussion:  She has nocturnal hypoxemia.  Her recently sleep study otherwise was negative for sleep apnea.  She has second hand tobacco exposure and reports several episodes of COVID.  Assessment/Plan:   Nocturnal hypoxemia. - will need to repeat overnight oximetry on room air and then determine if she would still qualify for supplemental oxygen at night - will arrange for chest xray, Echo and pulmonary function test - might need repeat sleep study  Tremor. - followed at [facility]  Multinodular thyroid with subclinical hyperthyroidism. - followed by Dr. Iran Planas with [facility] end[facility]inology  Time Spent Involved in Patient Care on Day of Examination:  50 minutes  Follow up:   Patient Instructions  Chest xray today  Will arrange for echocardiogram, overnight oxygen test and pulmonary function test  Follow up in 6 weeks with Dr. Halford Chessman or a  Nurse Practitioner after you complete your pulmonary function test  Medication List:   Allergies as of 05/08/2022       Reactions   Clonidine    Other reaction(s): Other (See Comments)   Levofloxacin    Other reaction(s): Other (See Comments)   Ofloxacin Nausea And Vomiting   Quinine Nausea And Vomiting   Quinine Derivatives         Medication List        Accurate as of May 08, 2022 10:10 AM. If you have any questions, ask your nurse or doctor.          aspirin EC 81 MG tablet Take 1 tablet by mouth daily.   CALCIUM MAGNESIUM PO Take 1 tablet by mouth daily.   cetirizine 10 MG tablet Commonly known as: ZYRTEC Take 10 mg by mouth daily.    Cholecalciferol 50 MCG (2000 UT) Caps Take 1 tablet by mouth daily.   ciprofloxacin-dexamethasone OTIC suspension Commonly known as: Ciprodex Place 4 drops into both ears 2 (two) times daily.   cyclobenzaprine 5 MG tablet Commonly known as: FLEXERIL Take 2 tablets (10 mg total) by mouth 2 (two) times daily as needed for muscle spasms.   diazepam 5 MG tablet Commonly known as: VALIUM Take 5 mg by mouth daily as needed.   diazepam 5 MG tablet Commonly known as: Valium Take 1-2 tablets 30 minutes prior to MRI, may repeat once as needed. Must have driver.   methimazole 5 MG tablet Commonly known as: TAPAZOLE Take 5 mg by mouth daily.   metoprolol succinate 25 MG 24 hr tablet Commonly known as: TOPROL-XL Take 25 mg by mouth daily.   simvastatin 40 MG tablet Commonly known as: ZOCOR Take 1 tablet by mouth at bedtime.   venlafaxine XR 150 MG 24 hr capsule Commonly known as: EFFEXOR-XR Take 1 capsule by mouth daily.        Signature:  Coralyn Helling, MD [facility] Pulmonary/Critical Care Pager - [phone] 05/08/2022, 10:10 AM

## 2022-05-08 NOTE — Patient Instructions (Addendum)
Chest xray today  Will arrange for echocardiogram, overnight oxygen test and pulmonary function test  Follow up in 6 weeks with Dr. Halford Chessman or a Nurse Practitioner after you complete your pulmonary function test

## 2022-05-23 ENCOUNTER — Ambulatory Visit: Payer: PPO | Attending: Pulmonary Disease

## 2022-05-23 DIAGNOSIS — R0600 Dyspnea, unspecified: Secondary | ICD-10-CM | POA: Diagnosis not present

## 2022-05-23 LAB — ECHOCARDIOGRAM COMPLETE
Area-P 1/2: 3.91 cm2
S' Lateral: 2.1 cm

## 2022-06-01 DIAGNOSIS — M542 Cervicalgia: Secondary | ICD-10-CM | POA: Diagnosis not present

## 2022-06-01 DIAGNOSIS — F3341 Major depressive disorder, recurrent, in partial remission: Secondary | ICD-10-CM | POA: Diagnosis not present

## 2022-06-01 DIAGNOSIS — F411 Generalized anxiety disorder: Secondary | ICD-10-CM | POA: Diagnosis not present

## 2022-06-03 ENCOUNTER — Telehealth: Payer: Self-pay | Admitting: Pulmonary Disease

## 2022-06-03 DIAGNOSIS — R0902 Hypoxemia: Secondary | ICD-10-CM

## 2022-06-03 DIAGNOSIS — R0600 Dyspnea, unspecified: Secondary | ICD-10-CM

## 2022-06-03 NOTE — Telephone Encounter (Signed)
ONO with RA 06/03/22 >> test time 50 minutes.  Basal SpO2 90.2%, low SpO2 87%.  Spent 4 min 25 sec with SpO2 < 88%  Please let her know that the overnight oxygen test device didn't record long enough to determine whether she still needs supplemental oxygen.  She needs at least several hours of test data, but this only recorded 50 minutes.  Please reorder overnight oximetry with room air.

## 2022-06-04 NOTE — Telephone Encounter (Signed)
Atc patient. Left a detailed vm on home answering machine [phone] (ok per dpr) letting patient know that we needed to reorder ONO test. Advised patient to call back if she had any questions/ concerns. Nothing further needed for now.

## 2022-06-25 ENCOUNTER — Ambulatory Visit (INDEPENDENT_AMBULATORY_CARE_PROVIDER_SITE_OTHER): Payer: PPO | Admitting: Adult Health

## 2022-06-25 ENCOUNTER — Ambulatory Visit (INDEPENDENT_AMBULATORY_CARE_PROVIDER_SITE_OTHER): Payer: PPO | Admitting: Pulmonary Disease

## 2022-06-25 ENCOUNTER — Encounter: Payer: Self-pay | Admitting: Adult Health

## 2022-06-25 VITALS — BP 108/70 | HR 102 | Temp 98.2°F | Ht 64.5 in | Wt 172.6 lb

## 2022-06-25 DIAGNOSIS — R0902 Hypoxemia: Secondary | ICD-10-CM

## 2022-06-25 DIAGNOSIS — G4734 Idiopathic sleep related nonobstructive alveolar hypoventilation: Secondary | ICD-10-CM

## 2022-06-25 LAB — CBC WITH DIFFERENTIAL/PLATELET
Basophils Absolute: 0 10*3/uL (ref 0.0–0.1)
Basophils Relative: 0.7 % (ref 0.0–3.0)
Eosinophils Absolute: 0.1 10*3/uL (ref 0.0–0.7)
Eosinophils Relative: 2.1 % (ref 0.0–5.0)
HCT: 43.1 % (ref 36.0–46.0)
Hemoglobin: 14.1 g/dL (ref 12.0–15.0)
Lymphocytes Relative: 27.9 % (ref 12.0–46.0)
Lymphs Abs: 1.8 10*3/uL (ref 0.7–4.0)
MCHC: 32.6 g/dL (ref 30.0–36.0)
MCV: 92.1 fl (ref 78.0–100.0)
Monocytes Absolute: 0.7 10*3/uL (ref 0.1–1.0)
Monocytes Relative: 10.5 % (ref 3.0–12.0)
Neutro Abs: 3.8 10*3/uL (ref 1.4–7.7)
Neutrophils Relative %: 58.8 % (ref 43.0–77.0)
Platelets: 257 10*3/uL (ref 150.0–400.0)
RBC: 4.68 Mil/uL (ref 3.87–5.11)
RDW: 13.4 % (ref 11.5–15.5)
WBC: 6.5 10*3/uL (ref 4.0–10.5)

## 2022-06-25 LAB — PULMONARY FUNCTION TEST
DL/VA % pred: 100 %
DL/VA: 4.12 ml/min/mmHg/L
DLCO cor % pred: 94 %
DLCO cor: 18.41 ml/min/mmHg
DLCO unc % pred: 94 %
DLCO unc: 18.41 ml/min/mmHg
FEF 25-75 Post: 2.33 L/sec
FEF 25-75 Pre: 1.89 L/sec
FEF2575-%Change-Post: 22 %
FEF2575-%Pred-Post: 130 %
FEF2575-%Pred-Pre: 106 %
FEV1-%Change-Post: 6 %
FEV1-%Pred-Post: 103 %
FEV1-%Pred-Pre: 97 %
FEV1-Post: 2.3 L
FEV1-Pre: 2.17 L
FEV1FVC-%Change-Post: 4 %
FEV1FVC-%Pred-Pre: 100 %
FEV6-%Change-Post: 2 %
FEV6-%Pred-Post: 103 %
FEV6-%Pred-Pre: 101 %
FEV6-Post: 2.91 L
FEV6-Pre: 2.84 L
FEV6FVC-%Change-Post: 0 %
FEV6FVC-%Pred-Post: 104 %
FEV6FVC-%Pred-Pre: 104 %
FVC-%Change-Post: 1 %
FVC-%Pred-Post: 98 %
FVC-%Pred-Pre: 96 %
FVC-Post: 2.91 L
FVC-Pre: 2.85 L
Post FEV1/FVC ratio: 79 %
Post FEV6/FVC ratio: 100 %
Pre FEV1/FVC ratio: 76 %
Pre FEV6/FVC Ratio: 100 %
RV % pred: 109 %
RV: 2.48 L
TLC % pred: 112 %
TLC: 5.78 L

## 2022-06-25 NOTE — Progress Notes (Unsigned)
@Patient  ID: Kristin Figueroa, female    DOB: July 14, 1951, 71 y.o.   MRN: [id number]  Chief Complaint  Patient presents with   Follow-up    Referring provider: [id number], NP  HPI: 71 year old female never smoker seen for consult May 08, 2022 for nocturnal hypoxemia  TEST/EVENTS :  PSG 01/09/22 >> AHI 0.5, SpO2 low 85%.  Spent 35 min with SpO2 < 89% ONO with RA 02/13/22 >> test time 9 hrs 31 min.  Basal SpO2 90.5% and SpO2 low 79%.  Spent 1 hr 55 min with SpO2 < 88% ONO with RA 06/03/22 >> test time 50 minutes.  Basal SpO2 90.2%, low SpO2 87%.  Spent 4 min 25 sec with SpO2 < 88%   06/25/2022 Follow up : Nocturnal hypoxemia  Patient returns for a 62-month follow up.  Patient was seen last visit for a consult for nocturnal hypoxemia.  Patient was set up for a in lab sleep study done Jan 09, 2022 that showed no significant sleep apnea but nocturnal hypoxemia.  She was set up for an overnight oximetry test February 13, 2022 that showed 1 hour 55 minutes were spent less than 88%.  Patient last visit had to have a repeat overnight oximetry test to document ongoing nocturnal hypoxemia unfortunately only 50 minutes of recordable data was completed.  Of this test showed SPO2 low at 87% and 4 minutes spent less than 88%.  A repeat overnight oximetry test has been ordered and is pending.  Chest x-ray last visit showed clear lungs.  PFTs today showed normal lung function.  FEV1 103%, ratio 79, FVC 98%, no significant bronchodilator response, DLCO 94%.  2D echo May 23, 2022 was normal EF 50 to 55%, mild LVH, grade 1 diastolic dysfunction, normal pulmonary artery systolic pressure, mild mitral valve regurg without stenosis. Takes valium and flexeril on occasion. No sleep aides.  No coughing or dyspnea.       Allergies  Allergen Reactions   Clonidine     Other reaction(s): Other (See Comments)   Levofloxacin     Other reaction(s): Other (See Comments)   Ofloxacin Nausea And Vomiting   Quinine  Nausea And Vomiting   Quinine Derivatives     Immunization History  Administered Date(s) Administered   Influenza-Unspecified 06/13/2018, 06/01/2021    Past Medical History:  Diagnosis Date   Depression with anxiety    High cholesterol    Multinodular thyroid    Osteopenia    Subclinical hyperthyroidism    Tremor    Vitamin D deficiency     Tobacco History: Social History   Tobacco Use  Smoking Status Never  Smokeless Tobacco Never   Counseling given: Not Answered   Outpatient Medications Prior to Visit  Medication Sig Dispense Refill   aspirin 81 MG EC tablet Take 1 tablet by mouth daily.     Calcium-Magnesium-Vitamin D (CALCIUM MAGNESIUM PO) Take 1 tablet by mouth daily.     cetirizine (ZYRTEC) 10 MG tablet Take 10 mg by mouth daily.     Cholecalciferol 50 MCG (2000 UT) CAPS Take 1 tablet by mouth daily.     ciprofloxacin-dexamethasone (CIPRODEX) OTIC suspension Place 4 drops into both ears 2 (two) times daily. 7.5 mL 0   cyclobenzaprine (FLEXERIL) 5 MG tablet Take 2 tablets (10 mg total) by mouth 2 (two) times daily as needed for muscle spasms. 20 tablet 0   diazepam (VALIUM) 5 MG tablet Take 5 mg by mouth daily as needed.  diazepam (VALIUM) 5 MG tablet Take 1-2 tablets 30 minutes prior to MRI, may repeat once as needed. Must have driver. 3 tablet 0   methimazole (TAPAZOLE) 5 MG tablet Take 5 mg by mouth daily.     metoprolol succinate (TOPROL-XL) 25 MG 24 hr tablet Take 25 mg by mouth daily.     simvastatin (ZOCOR) 40 MG tablet Take 1 tablet by mouth at bedtime.     venlafaxine XR (EFFEXOR-XR) 150 MG 24 hr capsule Take 1 capsule by mouth daily.     No facility-administered medications prior to visit.     Review of Systems:   Constitutional:   No  weight loss, night sweats,  Fevers, chills, fatigue, or  lassitude.  HEENT:   No headaches,  Difficulty swallowing,  Tooth/dental problems, or  Sore throat,                No sneezing, itching, ear ache, nasal  congestion, post nasal drip,   CV:  No chest pain,  Orthopnea, PND, swelling in lower extremities, anasarca, dizziness, palpitations, syncope.   GI  No heartburn, indigestion, abdominal pain, nausea, vomiting, diarrhea, change in bowel habits, loss of appetite, bloody stools.   Resp: No shortness of breath with exertion or at rest.  No excess mucus, no productive cough,  No non-productive cough,  No coughing up of blood.  No change in color of mucus.  No wheezing.  No chest wall deformity  Skin: no rash or lesions.  GU: no dysuria, change in color of urine, no urgency or frequency.  No flank pain, no hematuria   MS:  No joint pain or swelling.  No decreased range of motion.  No back pain.    Physical Exam  There were no vitals taken for this visit.  GEN: A/Ox3; pleasant , NAD, well nourished    HEENT:  [place]/AT,  EACs-clear, TMs-wnl, NOSE-clear, THROAT-clear, no lesions, no postnasal drip or exudate noted.   NECK:  Supple w/ fair ROM; no JVD; normal carotid impulses w/o bruits; no thyromegaly or nodules palpated; no lymphadenopathy.    RESP  Clear  P & A; w/o, wheezes/ rales/ or rhonchi. no accessory muscle use, no dullness to percussion  CARD:  RRR, no m/r/g, no peripheral edema, pulses intact, no cyanosis or clubbing.  GI:   Soft & nt; nml bowel sounds; no organomegaly or masses detected.   Musco: Warm bil, no deformities or joint swelling noted.   Neuro: alert, no focal deficits noted.    Skin: Warm, no lesions or rashes    Lab Results:  CBC    Component Value Date/Time   WBC 9.3 07/19/2021 2034   RBC 4.87 07/19/2021 2034   HGB 14.9 07/19/2021 2034   HCT 45.5 07/19/2021 2034   PLT 341 07/19/2021 2034   MCV 93.4 07/19/2021 2034   MCH 30.6 07/19/2021 2034   MCHC 32.7 07/19/2021 2034   RDW 12.4 07/19/2021 2034   LYMPHSABS 1.8 07/19/2021 2034   MONOABS 1.1 (H) 07/19/2021 2034   EOSABS 0.1 07/19/2021 2034   BASOSABS 0.1 07/19/2021 2034    BMET    Component  Value Date/Time   NA 137 07/19/2021 2034   K 4.0 07/19/2021 2034   CL 103 07/19/2021 2034   CO2 24 07/19/2021 2034   GLUCOSE 127 (H) 07/19/2021 2034   BUN 20 07/19/2021 2034   CREATININE 1.04 (H) 07/19/2021 2034   CALCIUM 9.1 07/19/2021 2034   GFRNONAA 57 (L) 07/19/2021 2034  BNP No results found for: "BNP"  ProBNP No results found for: "PROBNP"  Imaging: No results found.       Latest Ref Rng & Units 06/25/2022    9:46 AM  PFT Results  FVC-Pre L 2.85  P  FVC-Predicted Pre % 96  P  FVC-Post L 2.91  P  FVC-Predicted Post % 98  P  Pre FEV1/FVC % % 76  P  Post FEV1/FCV % % 79  P  FEV1-Pre L 2.17  P  FEV1-Predicted Pre % 97  P  FEV1-Post L 2.30  P  DLCO uncorrected ml/min/mmHg 18.41  P  DLCO UNC% % 94  P  DLCO corrected ml/min/mmHg 18.41  P  DLCO COR %Predicted % 94  P  DLVA Predicted % 100  P  TLC L 5.78  P  TLC % Predicted % 112  P  RV % Predicted % 109  P    P Preliminary result    No results found for: "NITRICOXIDE"      Assessment & Plan:   No problem-specific Assessment & Plan notes found for this encounter.     Rubye Oaks, NP 06/25/2022

## 2022-06-25 NOTE — Progress Notes (Signed)
PFT done today. 

## 2022-06-25 NOTE — Patient Instructions (Addendum)
Overnight oximetry test.  Labs today.  Follow up with Dr. Craige Cotta  in 3 months and As needed

## 2022-06-27 DIAGNOSIS — G4734 Idiopathic sleep related nonobstructive alveolar hypoventilation: Secondary | ICD-10-CM | POA: Insufficient documentation

## 2022-06-27 NOTE — Assessment & Plan Note (Addendum)
Nocturnal hypoxemia-questionable etiology.  Work-up has been unrevealing.  Pulmonary function testing essentially 2D echo with preserved EF and normal pulmonary artery systolic pressure.  Chest x-ray was clear.  Sleep study negative.  Unfortunately repeat overnight oximetry test was inadequate on time.  We will need to repeat.  We will check CBC to rule out anemia  Plan  Patient Instructions  Overnight oximetry test.  Labs today.  Follow up with Dr. Craige Cotta  in 3 months and As needed

## 2022-06-27 NOTE — Progress Notes (Signed)
Reviewed and agree with assessment/plan.   Mithcell Schumpert, MD [facility] Pulmonary/Critical Care 06/27/2022, 11:01 AM Pager:  [phone]  

## 2022-06-29 DIAGNOSIS — L299 Pruritus, unspecified: Secondary | ICD-10-CM | POA: Diagnosis not present

## 2022-06-29 DIAGNOSIS — F3341 Major depressive disorder, recurrent, in partial remission: Secondary | ICD-10-CM | POA: Diagnosis not present

## 2022-06-29 DIAGNOSIS — F411 Generalized anxiety disorder: Secondary | ICD-10-CM | POA: Diagnosis not present

## 2022-07-02 ENCOUNTER — Telehealth: Payer: Self-pay | Admitting: Internal Medicine

## 2022-07-02 NOTE — Telephone Encounter (Signed)
Kristin Figueroa was ordered overnight pulse oximetry by Rikki Spearing.  I do not believe I have seen this patient.  Time spent less than 88% and pulse ox was 24 minutes.  Plan - Sending this message to Tammy Lesly Rubenstein Irving Burton please give the overnight pulse oximetry results or Tammy Parrott

## 2022-07-03 NOTE — Telephone Encounter (Signed)
Herbert Seta can you get me copy of this

## 2022-07-04 NOTE — Telephone Encounter (Signed)
ONO report had been placed in the scan which I was able to find. I have placed this at TP's station for her to be able to view.  Routing encounter to Tammy.

## 2022-07-09 NOTE — Telephone Encounter (Signed)
Overnight oximetry test June 26, 2022 with duration of 10 hours and 24 minutes Showed very little desaturations with 24 minutes less than 88% average oxygen level 92.7%.  Time consecutively less than 88% 56 seconds.  We discussed results in detail . She is asymptomatic , workup has been unrevealing . Will hold off on Nocturnal Oxygen at this time. Will discuss with Dr. Craige Cotta .  Has ov in 3 months with Dr. Craige Cotta

## 2022-07-30 DIAGNOSIS — F3341 Major depressive disorder, recurrent, in partial remission: Secondary | ICD-10-CM | POA: Diagnosis not present

## 2022-07-30 DIAGNOSIS — F411 Generalized anxiety disorder: Secondary | ICD-10-CM | POA: Diagnosis not present

## 2022-08-10 DIAGNOSIS — J209 Acute bronchitis, unspecified: Secondary | ICD-10-CM | POA: Diagnosis not present

## 2022-08-30 DIAGNOSIS — F411 Generalized anxiety disorder: Secondary | ICD-10-CM | POA: Diagnosis not present

## 2022-08-30 DIAGNOSIS — Z9181 History of falling: Secondary | ICD-10-CM | POA: Diagnosis not present

## 2022-08-30 DIAGNOSIS — F3341 Major depressive disorder, recurrent, in partial remission: Secondary | ICD-10-CM | POA: Diagnosis not present

## 2022-10-02 DIAGNOSIS — F411 Generalized anxiety disorder: Secondary | ICD-10-CM | POA: Diagnosis not present

## 2022-10-02 DIAGNOSIS — F3341 Major depressive disorder, recurrent, in partial remission: Secondary | ICD-10-CM | POA: Diagnosis not present

## 2022-10-18 IMAGING — CT CT ANGIO HEAD-NECK (W OR W/O PERF)
1 of 11 series · 5 of 33 positions shown · IV contrast (Omnipaque)
Comparison: None available.

CLINICAL DATA: Initial evaluation for acute onset headache.

EXAM:
CT ANGIOGRAPHY HEAD AND NECK
TECHNIQUE: Multidetector CT imaging of the head and neck was performed using
the standard protocol during bolus administration of intravenous
contrast. Multiplanar CT image reconstructions and MIPs were
obtained to evaluate the vascular anatomy. Carotid stenosis
measurements (when applicable) are obtained utilizing NASCET
criteria, using the distal internal carotid diameter as the
denominator.
CONTRAST:  75mL OMNIPAQUE IOHEXOL 350 MG/ML SOLN

[Series 8: axial thin · axial · 0.52mm/px · z∈[+554,+797]mm · 5 of 365 slices shown]
[im 61/365  soft-tissue]
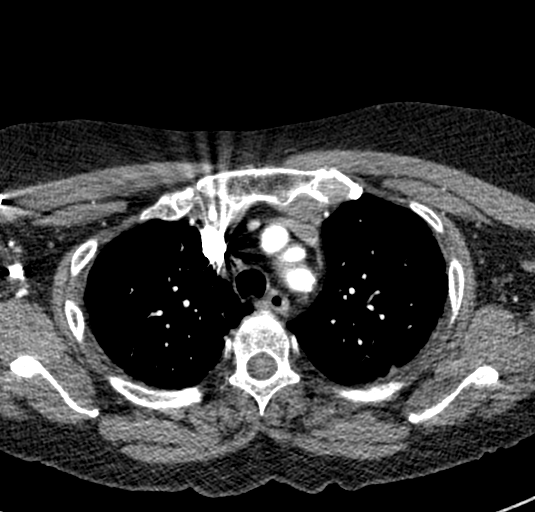
[im 122/365  bone]
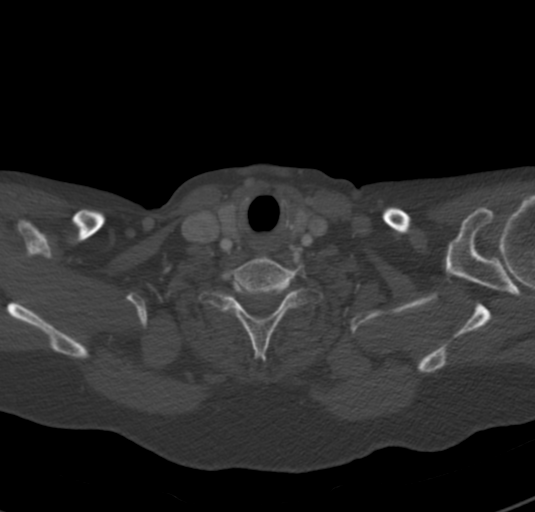
[im 183/365  soft-tissue]
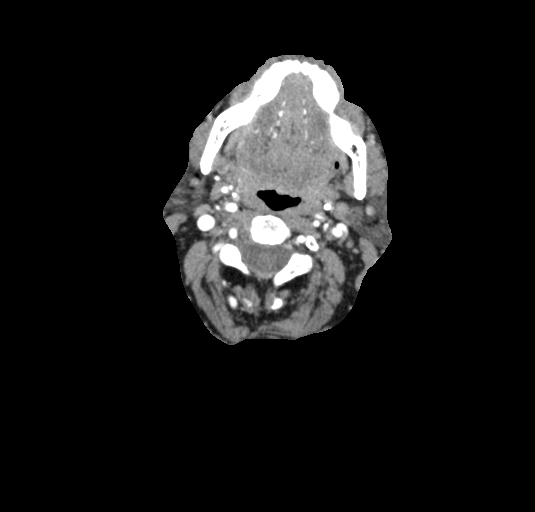
[im 243/365  bone]
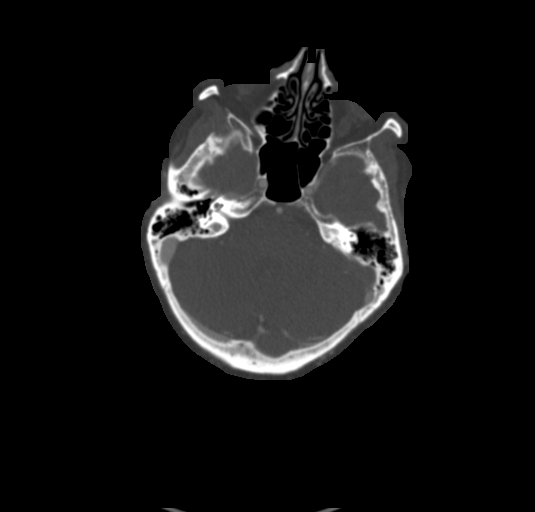
[im 304/365  soft-tissue]
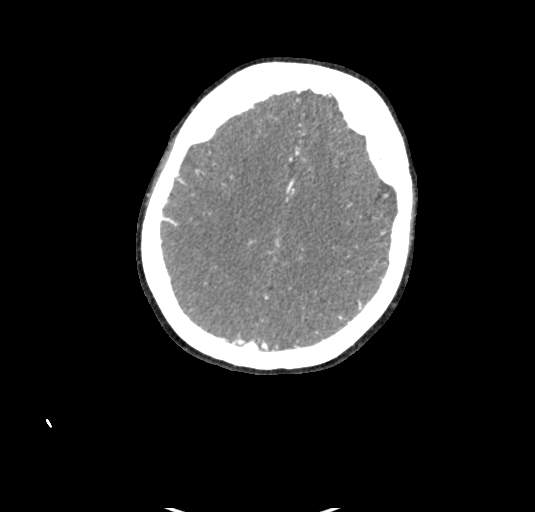

[5 of 33 positions shown; findings below may reference images not displayed]

FINDINGS: CT HEAD FINDINGS

Brain: Cerebral volume within normal limits for age. Mild chronic
microvascular ischemic disease with a few small remote lacunar
infarcts noted about the bilateral basal ganglia. No acute
intracranial hemorrhage. No acute large vessel territory infarct. No
mass lesion, midline shift or mass effect. No hydrocephalus or
extra-axial fluid collection.

Vascular: No hyperdense vessel.

Skull: Scalp soft tissues and calvarium within normal limits.

Sinuses: Paranasal sinuses and mastoid air cells are clear.

Orbits: Globes orbital soft tissues within normal limits.

Review of the MIP images confirms the above findings

CTA NECK FINDINGS

Aortic arch: Visualized aortic arch normal in caliber with normal
branch pattern. Minimal plaque about the origin of the left
subclavian artery. No significant stenosis about the origin the
great vessels.

Right carotid system: Right common and internal carotid arteries
widely patent without stenosis, dissection or occlusion.

Left carotid system: Left common and internal carotid arteries
widely patent without stenosis, dissection or occlusion.

Vertebral arteries: Both vertebral arteries arise from the
subclavian arteries. No proximal subclavian artery stenosis. Both
vertebral arteries widely patent without stenosis, dissection or
occlusion.

Skeleton: No acute osseous finding. No discrete or worrisome osseous
lesions. Mild for age spondylosis. Degenerative change noted about
the left TMJ.

Other neck: No other acute soft tissue abnormality within the neck.
1.6 cm right thyroid nodule noted. This has been previously
evaluated on prior thyroid ultrasound (ref: [HOSPITAL]. [DATE]): 143-50).

Upper chest: Scattered atelectatic changes noted within the
visualized lungs. Visualized upper chest demonstrates no other acute
finding.

Review of the MIP images confirms the above findings

CTA HEAD FINDINGS

Anterior circulation: Petrous segments patent bilaterally. Mild for
age atheromatous change within the carotid siphons without
significant stenosis. There is a tiny 1-2 mm outpouching arising
from the supraclinoid left ICA, favored to reflect a small vascular
infundibulum, although a possible tiny aneurysm is difficult to
exclude (series 10, image 150). A1 segments patent bilaterally.
Normal anterior communicating artery complex. Anterior cerebral
arteries patent to their distal aspects without stenosis. No M1
stenosis or occlusion. Normal MCA bifurcations. Distal MCA branches
well perfused and symmetric.

Posterior circulation: Both vertebral arteries widely patent to the
vertebrobasilar junction. Both PICA origins patent and normal.
Basilar widely patent to its distal aspect without stenosis.
Superior cerebral arteries patent bilaterally. Left PCA supplied via
the basilar. Right PCA supplied via the basilar as well as a robust
right posterior communicating artery. Both PCAs well perfused or
distal aspects without stenosis.

Venous sinuses: Patent allowing for timing the contrast bolus.

Anatomic variants: None significant.

Review of the MIP images confirms the above findings
IMPRESSION: CT HEAD IMPRESSION:

1. No acute intracranial abnormality.
2. Mild chronic microvascular ischemic disease for age, with a few
small remote lacunar infarcts about the bilateral basal ganglia.

CTA HEAD AND NECK IMPRESSION:

1. Negative CTA of the head and neck. No large vessel occlusion,
hemodynamically significant stenosis, or other acute vascular
abnormality.
2. Tiny 1-2 mm outpouching arising from the supraclinoid left ICA,
favored to reflect a small vascular infundibulum, although a
possible tiny aneurysm is difficult to exclude. Attention at
follow-up recommended.

## 2022-10-26 DIAGNOSIS — E042 Nontoxic multinodular goiter: Secondary | ICD-10-CM | POA: Diagnosis not present

## 2022-10-26 DIAGNOSIS — E559 Vitamin D deficiency, unspecified: Secondary | ICD-10-CM | POA: Diagnosis not present

## 2022-10-26 DIAGNOSIS — E0590 Thyrotoxicosis, unspecified without thyrotoxic crisis or storm: Secondary | ICD-10-CM | POA: Diagnosis not present

## 2022-11-20 DIAGNOSIS — I679 Cerebrovascular disease, unspecified: Secondary | ICD-10-CM | POA: Diagnosis not present

## 2022-11-20 DIAGNOSIS — F3341 Major depressive disorder, recurrent, in partial remission: Secondary | ICD-10-CM | POA: Diagnosis not present

## 2022-11-20 DIAGNOSIS — R739 Hyperglycemia, unspecified: Secondary | ICD-10-CM | POA: Diagnosis not present

## 2022-11-20 DIAGNOSIS — F411 Generalized anxiety disorder: Secondary | ICD-10-CM | POA: Diagnosis not present

## 2022-11-20 DIAGNOSIS — E785 Hyperlipidemia, unspecified: Secondary | ICD-10-CM | POA: Diagnosis not present

## 2022-11-20 DIAGNOSIS — M8580 Other specified disorders of bone density and structure, unspecified site: Secondary | ICD-10-CM | POA: Diagnosis not present

## 2022-11-20 DIAGNOSIS — E559 Vitamin D deficiency, unspecified: Secondary | ICD-10-CM | POA: Diagnosis not present

## 2022-11-20 DIAGNOSIS — Z139 Encounter for screening, unspecified: Secondary | ICD-10-CM | POA: Diagnosis not present

## 2022-11-20 DIAGNOSIS — E0590 Thyrotoxicosis, unspecified without thyrotoxic crisis or storm: Secondary | ICD-10-CM | POA: Diagnosis not present

## 2022-11-20 DIAGNOSIS — G4734 Idiopathic sleep related nonobstructive alveolar hypoventilation: Secondary | ICD-10-CM | POA: Diagnosis not present

## 2022-12-03 ENCOUNTER — Encounter (HOSPITAL_BASED_OUTPATIENT_CLINIC_OR_DEPARTMENT_OTHER): Payer: Self-pay | Admitting: Pulmonary Disease

## 2022-12-03 ENCOUNTER — Ambulatory Visit (INDEPENDENT_AMBULATORY_CARE_PROVIDER_SITE_OTHER): Payer: PPO | Admitting: Pulmonary Disease

## 2022-12-03 VITALS — BP 124/72 | HR 90 | Temp 98.4°F | Ht 64.5 in | Wt 174.0 lb

## 2022-12-03 DIAGNOSIS — G4734 Idiopathic sleep related nonobstructive alveolar hypoventilation: Secondary | ICD-10-CM

## 2022-12-03 NOTE — Progress Notes (Signed)
[facility]  Chief Complaint  Patient presents with   Follow-up    Follow up. No complaints.     Past Surgical History:  She  has no past surgical history on file.  Past Medical History:  Multinodular thyroid, Hyperthyroidism, Tremors, Vit D deficiency, HLD, Depression, Osteopenia, Anxiety  Constitutional:  BP 124/72 (BP Location: Left Arm, Patient Position: Sitting, Cuff Size: Normal)   Pulse 90   Temp 98.4 F (36.9 C) (Oral)   Ht 5' 4.5" (1.638 m)   Wt 174 lb (78.9 kg)   SpO2 94%   BMI 29.41 kg/m   Brief Summary:  Kristin Figueroa is a 72 y.o. female with nocturnal hypoxemia.      Subjective:   She is here with her daughter.  PFT, Echo, and chest xray were all normal.  ONO on room air showed mild, intermittent episodes of oxygen desaturation.  She is sleeping well. Not having trouble with daytime alertness.  Denies cough, wheeze, shortness of breath, or chest pain.  Doesn't feel like her breathing limits her activity level.  Physical Exam:   Appearance - well kempt   ENMT - no sinus tenderness, no oral exudate, no LAN, Mallampati 3 airway, no stridor  Respiratory - equal breath sounds bilaterally, no wheezing or rales  CV - s1s2 regular rate and rhythm, no murmurs  Ext - no clubbing, no edema  Skin - no rashes  Psych - normal mood and affect   Pulmonary testing:  PFT 06/25/22 >> FEV1 2.30 (103%), FEV1% 79, TLC 5.78 (112%), DLCO 94%  Sleep Tests:  PSG 01/09/22 >> AHI 0.5, SpO2 low 85%.  Spent 35 min with SpO2 < 89% ONO with RA 02/13/22 >> test time 9 hrs 31 min.  Basal SpO2 90.5% and SpO2 low 79%.  Spent 1 hr 55 min with SpO2 < 88% ONO with RA 06/27/22 >> test time 10 hrs 24 min.  Baseline SpO2 92.7%, SpO2 low 70%.  Spent 24 min with an SpO2 < 88%.  Cardiac Tests:  Echo 05/23/22 >> EF 50 to 55%, mild LVH, grade 1 DD, normal PASP, mild MR  Social History:  She  reports that she has never smoked. She has never  used smokeless tobacco. She reports that she does not drink alcohol and does not use drugs.  Family History:  Her family history includes Prader-Willi syndrome in her daughter.    Discussion:  She has mild, intermittent nocturnal hypoxemia.  Pattern on overnight oximetry is suggestive of REM related desaturation possibly related to some degree of obstructive events.  However, her previous sleep study didn't show significant sleep apnea.  Her pulmonary and cardiac assessment is unrevealing otherwise.  Assessment/Plan:   Nocturnal hypoxemia. - don't think she needs supplemental oxygen at this time - discussed symptoms to monitor for that would indicate she needs repeat assessment, possibly with repeat sleep testing - she will call if she feels like she needs follow up with pulmonary for repeat testing  Tremor. - followed at [facility]  Multinodular thyroid with subclinical hyperthyroidism. - followed by Dr. Corwin Levins with [facility] endocrinology  Time Spent Involved in Patient Care on Day of Examination:  25 minutes  Follow up:   Patient Instructions  Follow up as needed  Medication List:   Allergies as of 12/03/2022       Reactions   Clonidine    Other reaction(s): Other (See Comments)   Levofloxacin    Other reaction(s):  Other (See Comments)   Ofloxacin Nausea And Vomiting   Quinine Nausea And Vomiting   Quinine Derivatives         Medication List        Accurate as of December 03, 2022 10:11 AM. If you have any questions, ask your nurse or doctor.          aspirin EC 81 MG tablet Take 1 tablet by mouth daily.   CALCIUM MAGNESIUM PO Take 1 tablet by mouth daily.   cetirizine 10 MG tablet Commonly known as: ZYRTEC Take 10 mg by mouth daily.   Cholecalciferol 50 MCG (2000 UT) Caps Take 1 tablet by mouth daily.   ciprofloxacin-dexamethasone OTIC suspension Commonly known as: Ciprodex Place 4 drops into both ears 2 (two) times daily.    cyclobenzaprine 5 MG tablet Commonly known as: FLEXERIL Take 2 tablets (10 mg total) by mouth 2 (two) times daily as needed for muscle spasms. What changed: when to take this   diazepam 5 MG tablet Commonly known as: VALIUM Take 5 mg by mouth daily as needed.   methimazole 5 MG tablet Commonly known as: TAPAZOLE Take 5 mg by mouth daily.   metoprolol succinate 25 MG 24 hr tablet Commonly known as: TOPROL-XL Take 25 mg by mouth daily.   simvastatin 40 MG tablet Commonly known as: ZOCOR Take 1 tablet by mouth at bedtime.   venlafaxine XR 150 MG 24 hr capsule Commonly known as: EFFEXOR-XR Take 1 capsule by mouth daily.        Signature:  Coralyn Helling, MD [facility] Pager - [phone] 12/03/2022, 10:11 AM

## 2022-12-03 NOTE — Patient Instructions (Signed)
Follow up as needed

## 2023-01-03 DIAGNOSIS — J019 Acute sinusitis, unspecified: Secondary | ICD-10-CM | POA: Diagnosis not present

## 2023-01-03 DIAGNOSIS — J209 Acute bronchitis, unspecified: Secondary | ICD-10-CM | POA: Diagnosis not present

## 2023-01-03 DIAGNOSIS — R0981 Nasal congestion: Secondary | ICD-10-CM | POA: Diagnosis not present

## 2023-01-03 DIAGNOSIS — R051 Acute cough: Secondary | ICD-10-CM | POA: Diagnosis not present

## 2023-01-17 DIAGNOSIS — F3341 Major depressive disorder, recurrent, in partial remission: Secondary | ICD-10-CM | POA: Diagnosis not present

## 2023-01-17 DIAGNOSIS — F411 Generalized anxiety disorder: Secondary | ICD-10-CM | POA: Diagnosis not present

## 2023-01-17 DIAGNOSIS — E1165 Type 2 diabetes mellitus with hyperglycemia: Secondary | ICD-10-CM | POA: Diagnosis not present

## 2023-03-08 DIAGNOSIS — N3001 Acute cystitis with hematuria: Secondary | ICD-10-CM | POA: Diagnosis not present

## 2023-03-08 DIAGNOSIS — N3091 Cystitis, unspecified with hematuria: Secondary | ICD-10-CM | POA: Diagnosis not present

## 2023-03-20 DIAGNOSIS — R350 Frequency of micturition: Secondary | ICD-10-CM | POA: Diagnosis not present

## 2023-03-29 DIAGNOSIS — E0590 Thyrotoxicosis, unspecified without thyrotoxic crisis or storm: Secondary | ICD-10-CM | POA: Diagnosis not present

## 2023-03-29 DIAGNOSIS — R350 Frequency of micturition: Secondary | ICD-10-CM | POA: Diagnosis not present

## 2023-03-29 DIAGNOSIS — F3341 Major depressive disorder, recurrent, in partial remission: Secondary | ICD-10-CM | POA: Diagnosis not present

## 2023-03-29 DIAGNOSIS — G4734 Idiopathic sleep related nonobstructive alveolar hypoventilation: Secondary | ICD-10-CM | POA: Diagnosis not present

## 2023-03-29 DIAGNOSIS — N390 Urinary tract infection, site not specified: Secondary | ICD-10-CM | POA: Diagnosis not present

## 2023-03-29 DIAGNOSIS — F411 Generalized anxiety disorder: Secondary | ICD-10-CM | POA: Diagnosis not present

## 2023-03-29 DIAGNOSIS — I679 Cerebrovascular disease, unspecified: Secondary | ICD-10-CM | POA: Diagnosis not present

## 2023-03-29 DIAGNOSIS — N952 Postmenopausal atrophic vaginitis: Secondary | ICD-10-CM | POA: Diagnosis not present

## 2023-03-29 DIAGNOSIS — E785 Hyperlipidemia, unspecified: Secondary | ICD-10-CM | POA: Diagnosis not present

## 2023-03-29 DIAGNOSIS — M8580 Other specified disorders of bone density and structure, unspecified site: Secondary | ICD-10-CM | POA: Diagnosis not present

## 2023-03-29 DIAGNOSIS — E1165 Type 2 diabetes mellitus with hyperglycemia: Secondary | ICD-10-CM | POA: Diagnosis not present

## 2023-07-05 DIAGNOSIS — Z2821 Immunization not carried out because of patient refusal: Secondary | ICD-10-CM | POA: Diagnosis not present

## 2023-07-05 DIAGNOSIS — M8580 Other specified disorders of bone density and structure, unspecified site: Secondary | ICD-10-CM | POA: Diagnosis not present

## 2023-07-05 DIAGNOSIS — H60392 Other infective otitis externa, left ear: Secondary | ICD-10-CM | POA: Diagnosis not present

## 2023-07-05 DIAGNOSIS — F3341 Major depressive disorder, recurrent, in partial remission: Secondary | ICD-10-CM | POA: Diagnosis not present

## 2023-07-05 DIAGNOSIS — E0590 Thyrotoxicosis, unspecified without thyrotoxic crisis or storm: Secondary | ICD-10-CM | POA: Diagnosis not present

## 2023-07-05 DIAGNOSIS — N952 Postmenopausal atrophic vaginitis: Secondary | ICD-10-CM | POA: Diagnosis not present

## 2023-07-05 DIAGNOSIS — I679 Cerebrovascular disease, unspecified: Secondary | ICD-10-CM | POA: Diagnosis not present

## 2023-07-05 DIAGNOSIS — Z6829 Body mass index (BMI) 29.0-29.9, adult: Secondary | ICD-10-CM | POA: Diagnosis not present

## 2023-07-05 DIAGNOSIS — G4734 Idiopathic sleep related nonobstructive alveolar hypoventilation: Secondary | ICD-10-CM | POA: Diagnosis not present

## 2023-07-05 DIAGNOSIS — E785 Hyperlipidemia, unspecified: Secondary | ICD-10-CM | POA: Diagnosis not present

## 2023-07-05 DIAGNOSIS — F411 Generalized anxiety disorder: Secondary | ICD-10-CM | POA: Diagnosis not present

## 2023-07-05 DIAGNOSIS — E1165 Type 2 diabetes mellitus with hyperglycemia: Secondary | ICD-10-CM | POA: Diagnosis not present

## 2023-07-16 DIAGNOSIS — Z01818 Encounter for other preprocedural examination: Secondary | ICD-10-CM | POA: Diagnosis not present

## 2023-07-16 DIAGNOSIS — H2511 Age-related nuclear cataract, right eye: Secondary | ICD-10-CM | POA: Diagnosis not present

## 2023-07-16 DIAGNOSIS — H40013 Open angle with borderline findings, low risk, bilateral: Secondary | ICD-10-CM | POA: Diagnosis not present

## 2023-07-16 DIAGNOSIS — H2513 Age-related nuclear cataract, bilateral: Secondary | ICD-10-CM | POA: Diagnosis not present

## 2023-07-16 DIAGNOSIS — H40033 Anatomical narrow angle, bilateral: Secondary | ICD-10-CM | POA: Diagnosis not present

## 2023-07-23 DIAGNOSIS — H259 Unspecified age-related cataract: Secondary | ICD-10-CM | POA: Diagnosis not present

## 2023-07-23 DIAGNOSIS — Z79899 Other long term (current) drug therapy: Secondary | ICD-10-CM | POA: Diagnosis not present

## 2023-07-23 DIAGNOSIS — Z7982 Long term (current) use of aspirin: Secondary | ICD-10-CM | POA: Diagnosis not present

## 2023-07-23 DIAGNOSIS — H40013 Open angle with borderline findings, low risk, bilateral: Secondary | ICD-10-CM | POA: Diagnosis not present

## 2023-07-23 DIAGNOSIS — H2511 Age-related nuclear cataract, right eye: Secondary | ICD-10-CM | POA: Diagnosis not present

## 2023-08-08 DIAGNOSIS — R051 Acute cough: Secondary | ICD-10-CM | POA: Diagnosis not present

## 2023-08-08 DIAGNOSIS — M791 Myalgia, unspecified site: Secondary | ICD-10-CM | POA: Diagnosis not present

## 2023-08-08 DIAGNOSIS — R0981 Nasal congestion: Secondary | ICD-10-CM | POA: Diagnosis not present

## 2023-08-08 DIAGNOSIS — R519 Headache, unspecified: Secondary | ICD-10-CM | POA: Diagnosis not present

## 2023-08-14 ENCOUNTER — Ambulatory Visit (HOSPITAL_BASED_OUTPATIENT_CLINIC_OR_DEPARTMENT_OTHER)
Admission: EM | Admit: 2023-08-14 | Discharge: 2023-08-14 | Disposition: A | Discharge status: Home / Self Care | Payer: PPO | Attending: Internal Medicine | Admitting: Internal Medicine

## 2023-08-14 ENCOUNTER — Encounter (HOSPITAL_BASED_OUTPATIENT_CLINIC_OR_DEPARTMENT_OTHER): Payer: Self-pay | Admitting: Emergency Medicine

## 2023-08-14 DIAGNOSIS — B349 Viral infection, unspecified: Secondary | ICD-10-CM | POA: Diagnosis not present

## 2023-08-14 MED ORDER — PROMETHAZINE-DM 6.25-15 MG/5ML PO SYRP: 5.0000 mL | ORAL_SOLUTION | Freq: Four times a day (QID) | ORAL | 0 refills | Status: AC | PRN

## 2023-08-14 NOTE — ED Provider Notes (Signed)
 [facility]    CSN: [id number] Arrival date & time: 08/14/23  1108      History   Chief Complaint Chief Complaint  Patient presents with   Cough   sinus congestion    HPI Kristin Figueroa is a 73 y.o. female.    Cough Associated symptoms: fever (At onset of illness resolved), rhinorrhea and sore throat   Associated symptoms: no chest pain, no headaches, no shortness of breath and no wheezing   Sick since Christmas symptoms include rhinorrhea, nasal congestion, cough.  Had fever at onset of illness resolved.  Admits to fatigue.  Was seen in another urgent care center was treated with a steroid Dosepak and azithromycin.  Also given a cough medicine but she cannot recall the name.  States she has had no improvement of symptoms.  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 No pertinent surgical history.  OB History   No obstetric history on file.      Home Medications    Prior to Admission medications   Medication Sig Start Date End Date Taking? Authorizing Provider  aspirin 81 MG EC tablet Take 1 tablet by mouth daily.   Yes [provider]  methimazole (TAPAZOLE) 5 MG tablet Take 5 mg by mouth daily. 10/20/21  Yes [provider]  metoprolol succinate (TOPROL-XL) 25 MG 24 hr tablet Take 25 mg by mouth daily. 09/04/21  Yes [provider]  promethazine -dextromethorphan (PROMETHAZINE -DM) 6.25-15 MG/5ML syrup Take 5 mLs by mouth 4 (four) times daily as needed for up to 5 days for cough. 08/14/23 08/19/23 Yes Ruhani Umland, PA  simvastatin (ZOCOR) 40 MG tablet Take 1 tablet by mouth at bedtime. 09/24/16  Yes [provider]  venlafaxine XR (EFFEXOR-XR) 150 MG 24 hr capsule Take 1 capsule by mouth daily. 09/22/16  Yes [provider]  Calcium-Magnesium-Vitamin D (CALCIUM MAGNESIUM PO) Take 1 tablet by mouth daily.    [provider]  cetirizine (ZYRTEC) 10 MG tablet Take 10 mg by mouth daily.    [provider]  Cholecalciferol 50 MCG (2000 UT) CAPS Take 1 tablet by mouth daily.    [provider]  ciprofloxacin -dexamethasone  (CIPRODEX ) OTIC suspension Place 4 drops into both ears 2 (two) times daily. 03/11/22   Redwine, Madison A, PA-C  cyclobenzaprine  (FLEXERIL ) 5 MG tablet Take 2 tablets (10 mg total) by mouth 2 (two) times daily as needed for muscle spasms. Patient taking differently: Take 10 mg by mouth daily. 07/19/21   Tegeler, Lonni PARAS, MD  diazepam  (VALIUM ) 5 MG tablet Take 5 mg by mouth daily as needed. 10/19/21   [provider]    Family History Family History  Problem Relation Age of Onset   Prader-Willi syndrome Daughter     Social History Social History   Tobacco Use   Smoking status: Never   Smokeless tobacco: Never  Vaping Use   Vaping status: Never Used  Substance Use Topics   Alcohol use: Never   Drug use: Never     Allergies   Clonidine, Levofloxacin, Ofloxacin, Quinine, and Quinine derivatives   Review of Systems Review of Systems  Constitutional:  Positive for fatigue and fever (At onset of illness resolved).   HENT:  Positive for congestion, rhinorrhea and sore throat. Negative for postnasal drip and trouble swallowing.   Respiratory:  Positive for cough. Negative for shortness of breath and wheezing.   Cardiovascular:  Negative for chest pain.  Gastrointestinal:  Negative for diarrhea, nausea and vomiting.  Neurological:  Negative for headaches.     Physical Exam Triage Vital Signs ED Triage Vitals  Encounter Vitals Group     BP 08/14/23 1133 107/74     Systolic BP Percentile --      Diastolic BP Percentile --      Pulse Rate 08/14/23 1133 81     Resp 08/14/23 1133 18     Temp 08/14/23 1133 98.2 F (36.8 C)     Temp Source 08/14/23 1133 Oral     SpO2 08/14/23 1133 96 %     Weight --      Height --      Head Circumference --      Peak Flow --      Pain Score 08/14/23 1132 0     Pain Loc --      Pain Education --      Exclude from Growth Chart --    No data found.  Updated Vital Signs BP 107/74 (BP Location: Right Arm)   Pulse 81   Temp 98.2 F (36.8 C) (Oral)   Resp 18   SpO2 96%   Visual Acuity Right Eye Distance:   Left Eye Distance:   Bilateral Distance:    Right Eye Near:   Left Eye Near:    Bilateral Near:     Physical Exam Vitals and nursing note reviewed.  Constitutional:      Appearance: She is not ill-appearing.  HENT:     Head: Normocephalic and atraumatic.     Right Ear: Tympanic membrane and ear canal normal.     Left Ear: Tympanic membrane and ear canal normal.     Nose: Congestion present. No rhinorrhea.     Mouth/Throat:     Mouth: Mucous membranes are moist.     Pharynx: Oropharynx is clear. No oropharyngeal exudate or posterior oropharyngeal erythema.  Eyes:     Conjunctiva/sclera: Conjunctivae normal.  Cardiovascular:     Rate and Rhythm: Normal rate and regular rhythm.     Heart sounds: Normal heart sounds.  Pulmonary:     Effort: Pulmonary effort is normal.     Breath sounds: Normal breath sounds. No wheezing, rhonchi or rales.   Musculoskeletal:     Cervical back: Neck supple.  Lymphadenopathy:     Cervical: No cervical adenopathy.  Neurological:     Mental Status: She is alert and oriented to person, place, and time.  Psychiatric:        Mood and Affect: Mood normal.      UC Treatments / Results  Labs (all labs ordered are listed, but only abnormal results  are displayed) Labs Reviewed - No data to display  EKG   Radiology No results found.  Procedures Procedures (including critical care time)  Medications Ordered in UC Medications - No data to display  Initial Impression / Assessment and Plan / UC Course  I have reviewed the triage vital signs and the nursing notes.  Pertinent labs & imaging results that were available during my care of the patient were reviewed by me and considered in my medical decision making (see chart for details).     73 year old sick for 6 days with cough, congestion, rhinorrhea.  Has already been treated with a course of antibiotics and steroids with no improvement.  She is afebrile, well-appearing, nontoxic.  No evidence of bacterial infection in her ears or throat, her lungs are clear to auscultation.  Discussed with patient this is likely a viral illness recommend rest, increase fluids will Rx promethazine  to be used for cough.  Follow-up with PCP Final Clinical Impressions(s) / UC Diagnoses   Final diagnoses:  Viral syndrome   Discharge Instructions   None    ED Prescriptions     Medication Sig Dispense Auth. Provider   promethazine -dextromethorphan (PROMETHAZINE -DM) 6.25-15 MG/5ML syrup Take 5 mLs by mouth 4 (four) times daily as needed for up to 5 days for cough. 118 mL Trixie Maclaren, [place]      PDMP not reviewed this encounter.   Ason Heslin, [place] 08/14/23 1157

## 2023-08-14 NOTE — ED Triage Notes (Signed)
 Pt c/o coughing, sinus congestion x 1 week.

## 2023-08-15 DIAGNOSIS — J019 Acute sinusitis, unspecified: Secondary | ICD-10-CM | POA: Diagnosis not present

## 2023-08-15 DIAGNOSIS — J209 Acute bronchitis, unspecified: Secondary | ICD-10-CM | POA: Diagnosis not present

## 2023-08-19 DIAGNOSIS — Z9181 History of falling: Secondary | ICD-10-CM | POA: Diagnosis not present

## 2023-08-19 DIAGNOSIS — Z0000 Encounter for general adult medical examination without abnormal findings: Secondary | ICD-10-CM | POA: Diagnosis not present

## 2023-08-27 DIAGNOSIS — H259 Unspecified age-related cataract: Secondary | ICD-10-CM | POA: Diagnosis not present

## 2023-08-27 DIAGNOSIS — Z7982 Long term (current) use of aspirin: Secondary | ICD-10-CM | POA: Diagnosis not present

## 2023-08-27 DIAGNOSIS — H2512 Age-related nuclear cataract, left eye: Secondary | ICD-10-CM | POA: Diagnosis not present

## 2023-08-27 DIAGNOSIS — Z79899 Other long term (current) drug therapy: Secondary | ICD-10-CM | POA: Diagnosis not present

## 2023-10-07 DIAGNOSIS — F411 Generalized anxiety disorder: Secondary | ICD-10-CM | POA: Diagnosis not present

## 2023-10-07 DIAGNOSIS — E1165 Type 2 diabetes mellitus with hyperglycemia: Secondary | ICD-10-CM | POA: Diagnosis not present

## 2023-10-07 DIAGNOSIS — F3341 Major depressive disorder, recurrent, in partial remission: Secondary | ICD-10-CM | POA: Diagnosis not present

## 2023-10-07 DIAGNOSIS — M8580 Other specified disorders of bone density and structure, unspecified site: Secondary | ICD-10-CM | POA: Diagnosis not present

## 2023-10-07 DIAGNOSIS — Z6828 Body mass index (BMI) 28.0-28.9, adult: Secondary | ICD-10-CM | POA: Diagnosis not present

## 2023-10-07 DIAGNOSIS — E0590 Thyrotoxicosis, unspecified without thyrotoxic crisis or storm: Secondary | ICD-10-CM | POA: Diagnosis not present

## 2023-10-07 DIAGNOSIS — M8589 Other specified disorders of bone density and structure, multiple sites: Secondary | ICD-10-CM | POA: Diagnosis not present

## 2023-10-07 DIAGNOSIS — N952 Postmenopausal atrophic vaginitis: Secondary | ICD-10-CM | POA: Diagnosis not present

## 2023-10-07 DIAGNOSIS — E785 Hyperlipidemia, unspecified: Secondary | ICD-10-CM | POA: Diagnosis not present

## 2023-10-29 DIAGNOSIS — E0590 Thyrotoxicosis, unspecified without thyrotoxic crisis or storm: Secondary | ICD-10-CM | POA: Diagnosis not present

## 2023-10-29 DIAGNOSIS — E042 Nontoxic multinodular goiter: Secondary | ICD-10-CM | POA: Diagnosis not present

## 2023-10-29 DIAGNOSIS — E559 Vitamin D deficiency, unspecified: Secondary | ICD-10-CM | POA: Diagnosis not present

## 2023-12-06 DIAGNOSIS — H60531 Acute contact otitis externa, right ear: Secondary | ICD-10-CM | POA: Diagnosis not present

## 2023-12-06 DIAGNOSIS — H9201 Otalgia, right ear: Secondary | ICD-10-CM | POA: Diagnosis not present

## 2023-12-20 DIAGNOSIS — M85851 Other specified disorders of bone density and structure, right thigh: Secondary | ICD-10-CM | POA: Diagnosis not present

## 2023-12-20 DIAGNOSIS — Z13820 Encounter for screening for osteoporosis: Secondary | ICD-10-CM | POA: Diagnosis not present

## 2023-12-20 DIAGNOSIS — Z780 Asymptomatic menopausal state: Secondary | ICD-10-CM | POA: Diagnosis not present

## 2023-12-20 DIAGNOSIS — M85852 Other specified disorders of bone density and structure, left thigh: Secondary | ICD-10-CM | POA: Diagnosis not present

## 2023-12-20 DIAGNOSIS — M8589 Other specified disorders of bone density and structure, multiple sites: Secondary | ICD-10-CM | POA: Diagnosis not present

## 2023-12-20 DIAGNOSIS — Z1231 Encounter for screening mammogram for malignant neoplasm of breast: Secondary | ICD-10-CM | POA: Diagnosis not present

## 2023-12-24 DIAGNOSIS — H9202 Otalgia, left ear: Secondary | ICD-10-CM | POA: Diagnosis not present

## 2023-12-24 DIAGNOSIS — H6012 Cellulitis of left external ear: Secondary | ICD-10-CM | POA: Diagnosis not present

## 2024-01-07 DIAGNOSIS — R519 Headache, unspecified: Secondary | ICD-10-CM | POA: Diagnosis not present

## 2024-01-07 DIAGNOSIS — I679 Cerebrovascular disease, unspecified: Secondary | ICD-10-CM | POA: Diagnosis not present

## 2024-01-07 DIAGNOSIS — F3341 Major depressive disorder, recurrent, in partial remission: Secondary | ICD-10-CM | POA: Diagnosis not present

## 2024-01-07 DIAGNOSIS — E1165 Type 2 diabetes mellitus with hyperglycemia: Secondary | ICD-10-CM | POA: Diagnosis not present

## 2024-01-07 DIAGNOSIS — M8580 Other specified disorders of bone density and structure, unspecified site: Secondary | ICD-10-CM | POA: Diagnosis not present

## 2024-01-07 DIAGNOSIS — F411 Generalized anxiety disorder: Secondary | ICD-10-CM | POA: Diagnosis not present

## 2024-01-07 DIAGNOSIS — G4734 Idiopathic sleep related nonobstructive alveolar hypoventilation: Secondary | ICD-10-CM | POA: Diagnosis not present

## 2024-01-07 DIAGNOSIS — E0590 Thyrotoxicosis, unspecified without thyrotoxic crisis or storm: Secondary | ICD-10-CM | POA: Diagnosis not present

## 2024-01-07 DIAGNOSIS — E785 Hyperlipidemia, unspecified: Secondary | ICD-10-CM | POA: Diagnosis not present

## 2024-01-07 DIAGNOSIS — N952 Postmenopausal atrophic vaginitis: Secondary | ICD-10-CM | POA: Diagnosis not present

## 2024-01-07 DIAGNOSIS — E559 Vitamin D deficiency, unspecified: Secondary | ICD-10-CM | POA: Diagnosis not present

## 2024-01-10 DIAGNOSIS — R519 Headache, unspecified: Secondary | ICD-10-CM | POA: Diagnosis not present

## 2024-01-10 DIAGNOSIS — W57XXXA Bitten or stung by nonvenomous insect and other nonvenomous arthropods, initial encounter: Secondary | ICD-10-CM | POA: Diagnosis not present

## 2024-01-10 DIAGNOSIS — S20162A Insect bite (nonvenomous) of breast, left breast, initial encounter: Secondary | ICD-10-CM | POA: Diagnosis not present

## 2024-04-10 DIAGNOSIS — N952 Postmenopausal atrophic vaginitis: Secondary | ICD-10-CM | POA: Diagnosis not present

## 2024-04-10 DIAGNOSIS — E785 Hyperlipidemia, unspecified: Secondary | ICD-10-CM | POA: Diagnosis not present

## 2024-04-10 DIAGNOSIS — I679 Cerebrovascular disease, unspecified: Secondary | ICD-10-CM | POA: Diagnosis not present

## 2024-04-10 DIAGNOSIS — G4734 Idiopathic sleep related nonobstructive alveolar hypoventilation: Secondary | ICD-10-CM | POA: Diagnosis not present

## 2024-04-10 DIAGNOSIS — M8580 Other specified disorders of bone density and structure, unspecified site: Secondary | ICD-10-CM | POA: Diagnosis not present

## 2024-04-10 DIAGNOSIS — E1165 Type 2 diabetes mellitus with hyperglycemia: Secondary | ICD-10-CM | POA: Diagnosis not present

## 2024-04-10 DIAGNOSIS — E559 Vitamin D deficiency, unspecified: Secondary | ICD-10-CM | POA: Diagnosis not present

## 2024-04-10 DIAGNOSIS — F3341 Major depressive disorder, recurrent, in partial remission: Secondary | ICD-10-CM | POA: Diagnosis not present

## 2024-04-10 DIAGNOSIS — F411 Generalized anxiety disorder: Secondary | ICD-10-CM | POA: Diagnosis not present

## 2024-04-10 DIAGNOSIS — E0590 Thyrotoxicosis, unspecified without thyrotoxic crisis or storm: Secondary | ICD-10-CM | POA: Diagnosis not present

## 2024-04-10 DIAGNOSIS — R519 Headache, unspecified: Secondary | ICD-10-CM | POA: Diagnosis not present

## 2024-04-27 DIAGNOSIS — R519 Headache, unspecified: Secondary | ICD-10-CM | POA: Diagnosis not present

## 2024-04-27 DIAGNOSIS — R42 Dizziness and giddiness: Secondary | ICD-10-CM | POA: Diagnosis not present
